# Patient Record
Sex: Male | Born: 1960 | Race: Black or African American | Hispanic: No | Marital: Married | Smoking: Never smoker
Health system: Southern US, Community
[De-identification: ages and names within clinical notes are randomized; demographics above are authoritative.]

## PROBLEM LIST (undated history)

## (undated) DIAGNOSIS — B659 Schistosomiasis, unspecified: Secondary | ICD-10-CM

## (undated) DIAGNOSIS — I10 Essential (primary) hypertension: Secondary | ICD-10-CM

## (undated) DIAGNOSIS — E669 Obesity, unspecified: Secondary | ICD-10-CM

## (undated) DIAGNOSIS — L0291 Cutaneous abscess, unspecified: Secondary | ICD-10-CM

## (undated) HISTORY — DX: Essential (primary) hypertension: I10

## (undated) HISTORY — DX: Obesity, unspecified: E66.9

## (undated) HISTORY — DX: Schistosomiasis, unspecified: B65.9

## (undated) HISTORY — PX: OTHER SURGICAL HISTORY: SHX169

## (undated) HISTORY — DX: Cutaneous abscess, unspecified: L02.91

---

## 1999-09-20 ENCOUNTER — Ambulatory Visit (HOSPITAL_COMMUNITY): Admission: RE | Admit: 1999-09-20 | Discharge: 1999-09-20 | Payer: Self-pay | Admitting: Family Medicine

## 1999-09-20 ENCOUNTER — Encounter: Payer: Self-pay | Admitting: Family Medicine

## 2000-01-13 ENCOUNTER — Encounter: Payer: Self-pay | Admitting: Family Medicine

## 2000-01-13 ENCOUNTER — Ambulatory Visit (HOSPITAL_COMMUNITY): Admission: RE | Admit: 2000-01-13 | Discharge: 2000-01-13 | Payer: Self-pay | Admitting: Family Medicine

## 2000-03-03 DIAGNOSIS — B659 Schistosomiasis, unspecified: Secondary | ICD-10-CM

## 2000-03-03 HISTORY — DX: Schistosomiasis, unspecified: B65.9

## 2001-11-01 ENCOUNTER — Emergency Department (HOSPITAL_COMMUNITY): Admission: EM | Admit: 2001-11-01 | Discharge: 2001-11-01 | Payer: Self-pay | Admitting: Emergency Medicine

## 2003-05-14 ENCOUNTER — Encounter: Admission: RE | Admit: 2003-05-14 | Discharge: 2003-05-14 | Payer: Self-pay | Admitting: Internal Medicine

## 2003-05-29 ENCOUNTER — Encounter: Admission: RE | Admit: 2003-05-29 | Discharge: 2003-05-29 | Payer: Self-pay | Admitting: Internal Medicine

## 2003-08-08 ENCOUNTER — Encounter: Admission: RE | Admit: 2003-08-08 | Discharge: 2003-08-08 | Payer: Self-pay | Admitting: Internal Medicine

## 2004-08-06 ENCOUNTER — Ambulatory Visit: Payer: Self-pay | Admitting: Internal Medicine

## 2004-08-30 ENCOUNTER — Ambulatory Visit: Payer: Self-pay | Admitting: Internal Medicine

## 2004-11-07 ENCOUNTER — Inpatient Hospital Stay (HOSPITAL_COMMUNITY): Admission: EM | Admit: 2004-11-07 | Discharge: 2004-11-08 | Payer: Self-pay | Admitting: Emergency Medicine

## 2005-04-14 ENCOUNTER — Ambulatory Visit: Payer: Self-pay | Admitting: Internal Medicine

## 2005-06-22 ENCOUNTER — Ambulatory Visit: Payer: Self-pay | Admitting: Internal Medicine

## 2005-09-17 ENCOUNTER — Emergency Department (HOSPITAL_COMMUNITY): Admission: EM | Admit: 2005-09-17 | Discharge: 2005-09-18 | Payer: Self-pay | Admitting: Emergency Medicine

## 2006-04-30 ENCOUNTER — Emergency Department (HOSPITAL_COMMUNITY): Admission: EM | Admit: 2006-04-30 | Discharge: 2006-04-30 | Payer: Self-pay | Admitting: Emergency Medicine

## 2006-07-04 ENCOUNTER — Telehealth (INDEPENDENT_AMBULATORY_CARE_PROVIDER_SITE_OTHER): Payer: Self-pay | Admitting: *Deleted

## 2006-07-10 DIAGNOSIS — I10 Essential (primary) hypertension: Secondary | ICD-10-CM

## 2006-07-10 DIAGNOSIS — E669 Obesity, unspecified: Secondary | ICD-10-CM

## 2006-07-11 ENCOUNTER — Ambulatory Visit: Payer: Self-pay | Admitting: Internal Medicine

## 2006-07-12 ENCOUNTER — Encounter (INDEPENDENT_AMBULATORY_CARE_PROVIDER_SITE_OTHER): Payer: Self-pay | Admitting: Internal Medicine

## 2006-07-12 ENCOUNTER — Ambulatory Visit: Payer: Self-pay | Admitting: Hospitalist

## 2006-07-12 LAB — CONVERTED CEMR LAB
BUN: 9 mg/dL (ref 6–23)
CO2: 24 meq/L (ref 19–32)
Calcium: 8.9 mg/dL (ref 8.4–10.5)
Chloride: 102 meq/L (ref 96–112)
Creatinine, Ser: 0.91 mg/dL (ref 0.40–1.50)
Glucose, Bld: 89 mg/dL (ref 70–99)
Potassium: 4.4 meq/L (ref 3.5–5.3)
Sodium: 139 meq/L (ref 135–145)

## 2007-11-01 ENCOUNTER — Encounter: Payer: Self-pay | Admitting: Internal Medicine

## 2007-11-01 ENCOUNTER — Ambulatory Visit: Payer: Self-pay | Admitting: Internal Medicine

## 2007-11-01 DIAGNOSIS — E785 Hyperlipidemia, unspecified: Secondary | ICD-10-CM

## 2007-11-01 LAB — CONVERTED CEMR LAB
ALT: 27 units/L (ref 0–53)
AST: 26 units/L (ref 0–37)
Albumin: 4.4 g/dL (ref 3.5–5.2)
Alkaline Phosphatase: 66 units/L (ref 39–117)
BUN: 12 mg/dL (ref 6–23)
CO2: 26 meq/L (ref 19–32)
Calcium: 9.6 mg/dL (ref 8.4–10.5)
Chloride: 103 meq/L (ref 96–112)
Cholesterol: 163 mg/dL (ref 0–200)
Creatinine, Ser: 1.05 mg/dL (ref 0.40–1.50)
Glucose, Bld: 78 mg/dL (ref 70–99)
HDL: 29 mg/dL — ABNORMAL LOW (ref >39)
LDL Cholesterol: 92 mg/dL (ref 0–99)
Potassium: 4.1 meq/L (ref 3.5–5.3)
Sodium: 140 meq/L (ref 135–145)
TSH: 0.851 microintl units/mL (ref 0.350–5.50)
Total Bilirubin: 0.4 mg/dL (ref 0.3–1.2)
Total CHOL/HDL Ratio: 5.6
Total Protein: 8.5 g/dL — ABNORMAL HIGH (ref 6.0–8.3)
Triglycerides: 211 mg/dL — ABNORMAL HIGH (ref <150)
VLDL: 42 mg/dL — ABNORMAL HIGH (ref 0–40)

## 2008-04-24 ENCOUNTER — Encounter (INDEPENDENT_AMBULATORY_CARE_PROVIDER_SITE_OTHER): Payer: Self-pay | Admitting: *Deleted

## 2008-05-01 ENCOUNTER — Ambulatory Visit: Payer: Self-pay | Admitting: Internal Medicine

## 2008-05-01 ENCOUNTER — Encounter (INDEPENDENT_AMBULATORY_CARE_PROVIDER_SITE_OTHER): Payer: Self-pay | Admitting: *Deleted

## 2008-05-01 LAB — CONVERTED CEMR LAB
BUN: 10 mg/dL (ref 6–23)
Chloride: 104 meq/L (ref 96–112)
Creatinine, Ser: 0.75 mg/dL (ref 0.40–1.50)
Glucose, Bld: 82 mg/dL (ref 70–99)

## 2008-05-28 ENCOUNTER — Encounter (INDEPENDENT_AMBULATORY_CARE_PROVIDER_SITE_OTHER): Payer: Self-pay | Admitting: Internal Medicine

## 2008-05-28 ENCOUNTER — Ambulatory Visit: Payer: Self-pay | Admitting: Internal Medicine

## 2008-05-28 LAB — CONVERTED CEMR LAB
Alkaline Phosphatase: 69 units/L (ref 39–117)
CO2: 21 meq/L (ref 19–32)
Creatinine, Ser: 0.8 mg/dL (ref 0.40–1.50)
Glucose, Bld: 89 mg/dL (ref 70–99)
Sodium: 138 meq/L (ref 135–145)
Total Bilirubin: 0.4 mg/dL (ref 0.3–1.2)
Total Protein: 8.3 g/dL (ref 6.0–8.3)

## 2008-10-22 ENCOUNTER — Encounter (INDEPENDENT_AMBULATORY_CARE_PROVIDER_SITE_OTHER): Payer: Self-pay | Admitting: *Deleted

## 2008-10-22 ENCOUNTER — Ambulatory Visit: Payer: Self-pay | Admitting: Internal Medicine

## 2008-10-22 LAB — CONVERTED CEMR LAB
Albumin: 4.1 g/dL (ref 3.5–5.2)
Alkaline Phosphatase: 68 units/L (ref 39–117)
Anti Nuclear Antibody(ANA): NEGATIVE
BUN: 12 mg/dL (ref 6–23)
CO2: 23 meq/L (ref 19–32)
Calcium: 9.1 mg/dL (ref 8.4–10.5)
Chlamydia, Swab/Urine, PCR: NEGATIVE
Chloride: 106 meq/L (ref 96–112)
GFR calc Af Amer: >60 mL/min (ref >60)
GFR calc non Af Amer: >60 mL/min (ref >60)
Glucose, Bld: 79 mg/dL (ref 70–99)
LDL Cholesterol: 111 mg/dL — ABNORMAL HIGH (ref 0–99)
Potassium: 4.6 meq/L (ref 3.5–5.3)
Sodium: 141 meq/L (ref 135–145)
Total Protein: 8 g/dL (ref 6.0–8.3)
Triglycerides: 131 mg/dL (ref <150)
Uric Acid, Serum: 9.3 mg/dL — ABNORMAL HIGH (ref 4.0–7.8)
VLDL: 26 mg/dL (ref 0–40)

## 2008-12-02 ENCOUNTER — Telehealth (INDEPENDENT_AMBULATORY_CARE_PROVIDER_SITE_OTHER): Payer: Self-pay | Admitting: *Deleted

## 2009-01-28 ENCOUNTER — Ambulatory Visit: Payer: Self-pay | Admitting: Internal Medicine

## 2009-01-28 DIAGNOSIS — M25519 Pain in unspecified shoulder: Secondary | ICD-10-CM | POA: Insufficient documentation

## 2009-01-28 LAB — CONVERTED CEMR LAB

## 2009-04-17 ENCOUNTER — Telehealth: Payer: Self-pay | Admitting: Internal Medicine

## 2009-10-15 ENCOUNTER — Emergency Department (HOSPITAL_COMMUNITY): Admission: EM | Admit: 2009-10-15 | Discharge: 2009-10-15 | Payer: Self-pay | Admitting: Emergency Medicine

## 2009-10-16 ENCOUNTER — Ambulatory Visit: Payer: Self-pay | Admitting: Internal Medicine

## 2009-10-21 ENCOUNTER — Encounter: Payer: Self-pay | Admitting: Internal Medicine

## 2009-11-16 ENCOUNTER — Ambulatory Visit: Payer: Self-pay | Admitting: Internal Medicine

## 2009-11-17 LAB — CONVERTED CEMR LAB
ALT: 20 units/L (ref 0–53)
AST: 18 units/L (ref 0–37)
BUN: 12 mg/dL (ref 6–23)
Calcium: 9.1 mg/dL (ref 8.4–10.5)
Chloride: 108 meq/L (ref 96–112)
Cholesterol: 168 mg/dL (ref 0–200)
Creatinine, Ser: 0.89 mg/dL (ref 0.40–1.50)
HDL: 32 mg/dL — ABNORMAL LOW (ref >39)
Total Bilirubin: 0.4 mg/dL (ref 0.3–1.2)
Total CHOL/HDL Ratio: 5.3
VLDL: 33 mg/dL (ref 0–40)

## 2009-11-25 ENCOUNTER — Ambulatory Visit: Payer: Self-pay | Admitting: Internal Medicine

## 2009-12-10 ENCOUNTER — Telehealth: Payer: Self-pay | Admitting: Internal Medicine

## 2010-01-18 ENCOUNTER — Telehealth: Payer: Self-pay | Admitting: Internal Medicine

## 2010-07-13 NOTE — Assessment & Plan Note (Signed)
Summary: est-1 month f/u visit/ch   Vital Signs:  Patient profile:   50 year old male Height:      73 inches (185.42 cm) Weight:      289.4 pounds (129.95 kg) BMI:     37.86 Temp:     98.9 degrees F (37.17 degrees C) oral Pulse rate:   65 / minute BP sitting:   152 / 102  (right arm) Cuff size:   large  Vitals Entered By: Theotis Barrio NT II (November 25, 2009 10:34 AM) CC: PATIENT STATES HE IS HERE FOR FOLLOW UP ON BP MED. CHANGE Is Patient Diabetic? No Pain Assessment Patient in pain? no      Nutritional Status BMI of > 30 = obese  Have you ever been in a relationship where you felt threatened, hurt or afraid?No   Does patient need assistance? Functional Status Self care Ambulation Normal Comments FOLLOW UP ON BP MEDICATION CHANE   CC:  PATIENT STATES HE IS HERE FOR FOLLOW UP ON BP MED. CHANGE.  History of Present Illness: Shawn Lawrence is a 50 yo man with PMH as outlined below.  He is here for BP f/u.  Please refer to previous office visit for details.  In summary, he developed what sounds to be a gout flare on HCTZ/lisinopril combo.  This was stopped at [facility] and continued on lisinopril.  Last visit this was increased to 40mg .  He continues taking it without problems or further gout flares.    Preventive Screening-Counseling & Management  Alcohol-Tobacco     Smoking Status: never  Caffeine-Diet-Exercise     Does Patient Exercise: no  Current Medications (verified): 1)  Lisinopril 40 Mg Tabs (Lisinopril) .... Take 1 Pill By Mouth Daily.  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 ? Gout.....Marland Kitchenno aspiration  Review of Systems      See HPI  Physical Exam  General:  alert, healthy-appearing, cooperative to examination, and overweight-appearing.   Eyes:  anicteric, wearing glasses Lungs:  normal respiratory effort and no accessory muscle use.   Neurologic:  alert & oriented X3 and gait normal.   Psych:  Oriented X3, memory intact for recent and remote, and normally interactive.     Impression & Recommendations:  Problem # 1:  HYPERTENSION (ICD-401.9) Still elevated on lisinopril 40 HCTZ not tolerated due to ? gout flare will start amlodipine 5mg  ([facility] or [facility])  His updated medication list for this problem includes:    Lisinopril 40 Mg Tabs (Lisinopril) .Marland Kitchen... Take 1 pill by mouth daily.    Amlodipine Besylate 5 Mg Tabs (Amlodipine besylate) .Marland Kitchen... Take 1 tablet by mouth once a day  BP today: 152/102 Prior BP: 145/89 (10/16/2009)  Labs Reviewed: K+: 4.6 (11/16/2009) Creat: : 0.89 (11/16/2009)   Chol: 168 (11/16/2009)   HDL: 32 (11/16/2009)   LDL: 103 (11/16/2009)   TG: 165 (11/16/2009)  Problem # 2:  DYSLIPIDEMIA (ICD-272.4) TG minimally elevated LDL at goal for pt  Labs Reviewed: SGOT: 18 (11/16/2009)   SGPT: 20 (11/16/2009)   HDL:32 (11/16/2009), 32 (10/22/2008)  LDL:103 (11/16/2009), 111 (10/22/2008)  Chol:168 (11/16/2009), 169 (  10/22/2008)  Trig:165 (11/16/2009), 131 (10/22/2008)  Problem # 3:  ? of GOUT (ICD-274.9) no further flares  Complete Medication List: 1)  Lisinopril 40 Mg Tabs (Lisinopril) .... Take 1 pill by mouth daily. 2)  Amlodipine Besylate 5 Mg Tabs (Amlodipine besylate) .... Take 1 tablet by mouth once a day  Patient Instructions: 1)  Please schedule a follow-up appointment in 3 months. 2)  Continue taking lisinopril 3)  start amlodipine, [facility].  If not can get it at  [facility] pharmacy on [address]. 4)  If you have any problems before your next visit, call clinic.  Prescriptions: AMLODIPINE BESYLATE 5 MG TABS (AMLODIPINE BESYLATE) Take 1 tablet by mouth once a day  #30 x 3   Entered and Authorized by:   Mariea Stable MD   Signed by:   Mariea Stable MD on 11/25/2009   Method used:   Print then Give to Patient   RxID:   [id number]   Prevention & Chronic Care Immunizations   Influenza vaccine: Not documented   Influenza vaccine deferral: Not available  (11/25/2009)    Tetanus booster: Not documented   Td booster deferral: Deferred  (10/16/2009)    Pneumococcal vaccine: Not documented  Other Screening   PSA: Not documented   PSA action/deferral: Discussion deferred  (01/28/2009)   Smoking status: never  (11/25/2009)  Lipids   Total Cholesterol: 168  (11/16/2009)   LDL: 103  (11/16/2009)   LDL Direct: Not documented   HDL: 32  (11/16/2009)   Triglycerides: 165  (11/16/2009)    SGOT (AST): 18  (11/16/2009)   SGPT (ALT): 20  (11/16/2009)   Alkaline phosphatase: 65  (11/16/2009)   Total bilirubin: 0.4  (11/16/2009)    Lipid flowsheet reviewed?: Yes   Progress toward LDL goal: At goal  Hypertension   Last Blood Pressure: 152 / 102  (11/25/2009)   Serum creatinine: 0.89  (11/16/2009)   Serum potassium 4.6  (11/16/2009)    Hypertension flowsheet reviewed?: Yes   Progress toward BP goal: Unchanged  Self-Management Support :    Patient will work on the following items until the next clinic visit to reach self-care goals:     Medications and monitoring: take my medicines every day, bring all of my medications to every visit  (11/25/2009)     Eating: drink diet soda or water instead of juice or soda, eat more vegetables, use fresh or frozen vegetables, eat baked foods instead of fried foods, eat fruit for snacks and desserts, limit or avoid alcohol  (11/25/2009)    Hypertension self-management support: Resources for  patients handout, Written self-care plan  (11/25/2009)   Hypertension self-care plan printed.    Lipid self-management support: Resources for patients handout, Written self-care plan  (11/25/2009)   Lipid self-care plan printed.    Self-management comments: ENCOURAGED PATIENT TO TRY WALKING AROUND THE BLOCK AT LEAST 3 DAYS A WEEK      Resource handout printed.

## 2010-07-13 NOTE — Assessment & Plan Note (Signed)
Summary: UC/FU (Shawn Lawrence) SB.   Vital Signs:  Patient profile:   50 year old male Height:      73 inches Weight:      285.9 pounds BMI:     37.86 Temp:     97.8 degrees F oral Pulse rate:   76 / minute BP sitting:   145 / 89  (right arm)  Vitals Entered By: Filomena Jungling NT II (Oct 16, 2009 11:28 AM) CC: urgent care medicine Is Patient Diabetic? No Pain Assessment Patient in pain? no      Nutritional Status BMI of > 30 = obese  Have you ever been in a relationship where you felt threatened, hurt or afraid?No   Does patient need assistance? Functional Status Self care Ambulation Normal   CC:  urgent care medicine.  History of Present Illness: Mr. Shawn Lawrence is a 24 y o man with PMH as outlined in the EMR comes today for a f/u visit.   1. HTN: See below. He is not checking BP at home.   2. Gout: He had pain on his right knee and ankle and he went to urgent care and couldn't walk. He had xray and labworks and was started on indomethacin. He also had some pain on his left ankle and had x-ray of left and right ankle that did not show any significant changes. He also had basic chemistry and was insignificant. He was instructed to stop taking hctz/lisinopril combo and was given a new prescription of lisinopril 20 mg daily. Before he was given colchicine and he states that colchicine has really not helped him in the past, and yesterday from the ED he was given a new prescription as well. He is not sure if he wants to use it or not as it did not help him in the past.   Current Medications (verified): 1)  Lisinopril 40 Mg Tabs (Lisinopril) .... Take 1 Pill By Mouth Daily. 2)  Indomethacin 50 Mg Caps (Indomethacin) .... Take 1 Pill By Mouth Three Times A Day As Needed For Pain After Meal. 3)  Omeprazole 20 Mg Cpdr (Omeprazole) .... Take 1 Pill By Mouth Before Meal, When You Start Hurting Your Stomach.  Allergies: No Known Drug Allergies  Review of Systems      See HPI  Physical  Exam  Mouth:  pharynx pink and moist.   Lungs:  normal breath sounds, no crackles, and no wheezes.   Heart:  normal rate, regular rhythm, no murmur, no gallop, and no rub.   Abdomen:  soft, non-tender, and normal bowel sounds.   Extremities:  trace left pedal edema and trace right pedal edema.   Neurologic:  alert & oriented X3.     Impression & Recommendations:  Problem # 1:  ? of GOUT (ICD-274.9) Please see HPI. Pt probably had a gouty flare and his HCTZ was d/ced and he was started on indocin, that took care of his pain with 2 doses. He states that colchicine was really not working and he feels it is OK to remove it from his med list. I informed him the potential side effects of indocin, viz. gastritis/ulcer and renal dysfunction. I also encouraged him to take omeprazole if he has to take indocin regularly and inform the clinic if he takes it more than a week so that we can get a BMET. He states that there was an attmept to aspirate his knee once in the past and it failed and so he never had synovial  fluid study done for the diagnosis of gout. I noted his uric acid was normal when checked in the ED. If he develops recurrent flares, we can start him on allopurinol.   The following medications were removed from the medication list:    Colchicine 0.6 Mg Tabs (Colchicine) .Marland Kitchen... Take 4 pills on day one, then take 2 pills every day  Problem # 2:  DYSLIPIDEMIA (ICD-272.4)  Will check followings.   Orders: T-Comprehensive Metabolic Panel [id number]) T-Lipid Profile [phone])  Labs Reviewed: SGOT: 28 (10/22/2008)   SGPT: 27 (10/22/2008)   HDL:32 (10/22/2008), 29 (11/01/2007)  LDL:111 (10/22/2008), 92 (96/29/5284)  Chol:169 (10/22/2008), 163 (11/01/2007)  Trig:131 (10/22/2008), 211 (11/01/2007)  Problem # 3:  HYPERTENSION (ICD-401.9) Pt's lisinopril 20/hctz25 combo pill was changed to lisinopril 20 from ED recently. I will change this to 40 mg daily as his BP today is 145/89, and he was  already on 2 medicine to start with. Will f/u his BMET and see him in 2 wks.    His updated medication list for this problem includes:    Lisinopril 40 Mg Tabs (Lisinopril) .Marland Kitchen... Take 1 pill by mouth daily.  Orders: T-Comprehensive Metabolic Panel ([id number])  BP today: 145/89 Prior BP: 162/111 (01/28/2009)  Labs Reviewed: K+: 4.6 (10/22/2008) Creat: : 0.92 (10/22/2008)   Chol: 169 (10/22/2008)   HDL: 32 (10/22/2008)   LDL: 111 (10/22/2008)   TG: 131 (10/22/2008)  Problem # 4:  OBESITY NOS (ICD-278.00) Discussed about the need of regular exercise and losing wt.   Complete Medication List: 1)  Lisinopril 40 Mg Tabs (Lisinopril) .... Take 1 pill by mouth daily. 2)  Indomethacin 50 Mg Caps (Indomethacin) .... Take 1 pill by mouth three times a day as needed for pain after meal. 3)  Omeprazole 20 Mg Cpdr (Omeprazole) .... Take 1 pill by mouth before meal, when you start hurting your stomach.  Patient Instructions: 1)  Please schedule a follow-up appointment in 1 month. 2)  Limit your Sodium (Salt) to less than 2 grams a day(slightly less than 1/2 a teaspoon) to prevent fluid retention, swelling, or worsening of symptoms. 3)  It is important that you exercise regularly at least 20 minutes 5 times a week. If you develop chest pain, have severe difficulty breathing, or feel very tired , stop exercising immediately and seek medical attention. 4)  You need to lose weight. Consider a lower calorie diet and regular exercise.  5)  Check your Blood Pressure regularly. If it is above: you should make an appointment. Prescriptions: OMEPRAZOLE 20 MG CPDR (OMEPRAZOLE) take 1 pill by mouth before meal, when you start hurting your stomach.  #30 x 0   Entered and Authorized by:   Jason Coop MD   Signed by:   Jason Coop MD on 10/16/2009   Method used:   Print then Give to Patient   RxID:   [id number] LISINOPRIL 40 MG TABS (LISINOPRIL) take 1 pill by mouth daily.  #30 x 0    Entered and Authorized by:   Jason Coop MD   Signed by:   Jason Coop MD on 10/16/2009   Method used:   Print then Give to Patient   RxID:   [id number]  Process Orders Check Orders Results:     [facility]: ABN not required for this insurance Tests Sent for requisitioning (Oct 18, 2009 12:25 PM):     10/16/2009: [facility] -- T-Comprehensive Metabolic Panel [[id number]] (signed)     10/16/2009: [facility] --  T-Lipid Profile [phone] (signed)    Process Orders Check Orders Results:     [facility]: ABN not required for this insurance Tests Sent for requisitioning (Oct 18, 2009 12:25 PM):     10/16/2009: [facility] -- T-Comprehensive Metabolic Panel [[id number]] (signed)     10/16/2009: [facility] -- T-Lipid Profile [phone] (signed)    Prevention & Chronic Care Immunizations   Influenza vaccine: Not documented   Influenza vaccine deferral: Deferred  (10/16/2009)    Tetanus booster: Not documented   Td booster deferral: Deferred  (10/16/2009)    Pneumococcal vaccine: Not documented  Other Screening   PSA: Not documented   PSA action/deferral: Discussion deferred  (01/28/2009)   Smoking status: never  (01/28/2009)  Lipids   Total Cholesterol: 169  (10/22/2008)   LDL: 111  (10/22/2008)   LDL Direct: Not documented   HDL: 32  (10/22/2008)   Triglycerides: 131  (10/22/2008)    SGOT (AST): 28  (10/22/2008)   SGPT (ALT): 27  (10/22/2008) CMP ordered    Alkaline phosphatase: 68  (10/22/2008)   Total bilirubin: 0.4  (10/22/2008)    Lipid flowsheet reviewed?: Yes   Progress toward LDL goal: Unchanged  Hypertension   Last Blood Pressure: 145 / 89  (10/16/2009)   Serum creatinine: 0.92  (10/22/2008)   Serum potassium 4.6  (10/22/2008) CMP ordered     Hypertension flowsheet reviewed?: Yes   Progress toward BP goal:  Unchanged  Self-Management Support :    Hypertension self-management support: Written self-care plan  (10/16/2009)   Hypertension self-care plan printed.    Lipid self-management support: Written self-care plan  (10/16/2009)   Lipid self-care plan printed.

## 2010-07-13 NOTE — Progress Notes (Signed)
Summary: refill/ hla  Phone Note Refill Request Message from:  Patient on December 10, 2009 11:12 AM  Refills Requested: Medication #1:  LISINOPRIL 40 MG TABS take 1 pill by mouth daily.   Last Refilled: 11/03/2009 last visit 6/15 and labs 6/6   Method Requested: Fax to Local Pharmacy Initial call taken by: Marin Roberts RN,  December 10, 2009 11:13 AM  Follow-up for Phone Call        Rx faxed to pharmacy Follow-up by: Mariea Stable MD,  December 10, 2009 12:23 PM    Prescriptions: LISINOPRIL 40 MG TABS (LISINOPRIL) take 1 pill by mouth daily.  #30 x 6   Entered and Authorized by:   Mariea Stable MD   Signed by:   Mariea Stable MD on 12/10/2009   Method used:   Electronically to        [facility] [phone]* (retail)       [address]       [place], [place]  [address]       Ph: [phone]       Fax: [phone]   RxID:   [id number]

## 2010-07-13 NOTE — Progress Notes (Signed)
Summary: Refill/gh  Phone Note Refill Request Message from:  Fax from Pharmacy on January 18, 2010 5:28 PM  Refills Requested: Medication #1:  LISINOPRIL 40 MG TABS take 1 pill by mouth daily.   Last Refilled: 11/03/2009  Method Requested: Electronic Initial call taken by: Angelina Ok RN,  January 18, 2010 5:28 PM  Follow-up for Phone Call        Rx faxed to pharmacy Follow-up by: Mariea Stable MD,  January 26, 2010 9:50 AM    Prescriptions: LISINOPRIL 40 MG TABS (LISINOPRIL) take 1 pill by mouth daily.  #30 x 6   Entered and Authorized by:   Mariea Stable MD   Signed by:   Mariea Stable MD on 01/26/2010   Method used:   Faxed to ...       [facility] (retail)       [address] [place], [place]  [address]       Ph: [phone]       Fax: [phone]   RxID:   [id number]

## 2010-07-13 NOTE — Letter (Signed)
Summary: PHYSICIAN DOCUMENTION SHEET  PHYSICIAN DOCUMENTION SHEET   Imported By: Margie Billet 10/21/2009 15:31:30  _____________________________________________________________________  External Attachment:    Type:   Image     Comment:   External Document

## 2010-08-30 ENCOUNTER — Ambulatory Visit (INDEPENDENT_AMBULATORY_CARE_PROVIDER_SITE_OTHER): Payer: Self-pay | Admitting: Internal Medicine

## 2010-08-30 ENCOUNTER — Encounter: Payer: Self-pay | Admitting: Internal Medicine

## 2010-08-30 DIAGNOSIS — E785 Hyperlipidemia, unspecified: Secondary | ICD-10-CM

## 2010-08-30 DIAGNOSIS — I10 Essential (primary) hypertension: Secondary | ICD-10-CM

## 2010-08-30 DIAGNOSIS — E669 Obesity, unspecified: Secondary | ICD-10-CM

## 2010-08-30 LAB — COMPREHENSIVE METABOLIC PANEL
AST: 18 U/L (ref 0–37)
Alkaline Phosphatase: 71 U/L (ref 39–117)
BUN: 14 mg/dL (ref 6–23)
Glucose, Bld: 111 mg/dL — ABNORMAL HIGH (ref 70–99)
Potassium: 4 mEq/L (ref 3.5–5.3)
Sodium: 139 mEq/L (ref 135–145)
Total Bilirubin: 0.4 mg/dL (ref 0.3–1.2)

## 2010-08-30 NOTE — Progress Notes (Signed)
  Subjective:    Patient ID: Shawn Lawrence, male    DOB: January 27, 1961, 50 y.o.   MRN: [id number]  HPI Very nice gentleman here with HTN, obesity and dyslipidemia (low HDL) here on routine follow up. No significant interval issues, feels well except for occasional back pain, paraspinal, L3-L5 region, worse at the end of the day, correlating with his weight gain.  On Lisinopril for his BP-taken off HCTZ since he was getting spells of gout   Review of Systems Sleep occasionaly marred by low back ache-no snoring or daytime somnolence. Not getting any regular exercise    Objective:   Physical Exam See vital sign flow sheet       Assessment & Plan:   Obesity HTN Dydlipidemia  Plan refer to nutrition this week Regular non-weight and weight-bearing exercise: "100 calories per mile" walking Goal-regular sustained walking 5/week  Check A1c, CMET and Lipid Panel

## 2010-08-31 LAB — LIPID PANEL
HDL: 34 mg/dL — ABNORMAL LOW (ref >39)
LDL Cholesterol: 105 mg/dL — ABNORMAL HIGH (ref 0–99)
Triglycerides: 99 mg/dL (ref <150)
VLDL: 20 mg/dL (ref 0–40)

## 2010-08-31 LAB — POCT I-STAT, CHEM 8
Calcium, Ion: 1.15 mmol/L (ref 1.12–1.32)
Creatinine, Ser: 0.7 mg/dL (ref 0.4–1.5)
Glucose, Bld: 99 mg/dL (ref 70–99)
Hemoglobin: 18.4 g/dL — ABNORMAL HIGH (ref 13.0–17.0)
Sodium: 142 mEq/L (ref 135–145)
TCO2: 30 mmol/L (ref 0–100)

## 2010-08-31 LAB — HEMOGLOBIN A1C: Hgb A1c MFr Bld: 6 % — ABNORMAL HIGH (ref <5.7)

## 2010-09-03 ENCOUNTER — Ambulatory Visit (INDEPENDENT_AMBULATORY_CARE_PROVIDER_SITE_OTHER): Payer: Self-pay | Admitting: Dietician

## 2010-09-03 DIAGNOSIS — E669 Obesity, unspecified: Secondary | ICD-10-CM

## 2010-09-03 NOTE — Patient Instructions (Signed)
Make follow up appointment for 3 weeks.  Your wife is welcome to join [place].  At that time, we will weigh you, discuss how you are doing with your   juice,    milk,   pedometer,    answer questions and alter plans if needed.   I suggest you try to aim for 2000 calories  And 55 grams of fat each day.  You can aim for 500-600 in the morning time, 500-600 in the afternoon, 500-600 calories in the evening and one snack.  It is better to eat fruit than to drink juice: I suggest the whole family drink no more than 280 ounces of juice per week- which is 8 oz per day for each of you.  It is best to buy skim or 1% milk:   adults should drink 2 8-ounce cups a day and   teenage boys should drink  2-4 cups (8ounces)  each day  Please feel free to call with questions: Lupita Leash [phone]

## 2010-09-03 NOTE — Progress Notes (Signed)
Of note: patient reported a weight gain of 54.7 # in recent years.

## 2010-09-03 NOTE — Progress Notes (Signed)
Medical Nutrition Therapy:  Appt start time: 0930 end time:  1050.  Assessment:  Primary concerns today: Weight management, Blood sugar control and Meal planning Patient and family from [place]. They have been here since 2001. They do not eat out. Usual eating pattern includes Meal 1 and 1+ snacks per day. His wife shops and cooks for the family. They have 3 teenage sons. The family consumes large amounts of juice and some soda. They eat foods that contain much oil and milk is whole. Patient feels his problem is not how much he eats but how much he drinks that has calories.  Patient reports Usual physical activity includes walking 30 minutes a day 5 days per week at most. Much of his time is spent sitting.  Diagnosis and Intervention:  Progress Towards Goal(s):  No progress as he just learned today what to work on.    Nutritional Diagnosis:  NB-1.1 Food and nutrition-related knowledge deficit NB-1.4 Self-monitoring deficit As related to not knowing how much he eats or exercises at present.  As evidenced by patient reports.. NB-2.1 Physical inactivity As related to need for activity for weight maintenance, blood sugar control.  As evidenced by patient report.  Interventions:  1- education and counseling on weight loss principles and goals- 7% for him is ~ 20#.  2- education and counseling on reading labels for portion control and calorie counting 3- education and counseling on physical activity recommendations, using a step counter to self monitor. 4- Education about his laboratory values indicative of metabolic syndrome/pre-diabetes( low HDL, high LDL, glucose and A1C)  and changes that may occur with lifestyle change. 5- Coordination of care with physician concerning goals and patient self-report of depression.     Monitoring/Evaluation:  Dietary intake, activity in 3 week(s)

## 2010-10-29 NOTE — Discharge Summary (Signed)
Shawn Lawrence, Shawn Lawrence        ACCOUNT NO.:  [id number]   MEDICAL RECORD NO.:  [id number]          PATIENT TYPE:  INP   LOCATION:  3305                         FACILITY:  [facility]   PHYSICIAN:  Kinnie Scales. Annalee Genta, M.D.DATE OF BIRTH:  March 28, 1961   DATE OF ADMISSION:  11/07/2004  DATE OF DISCHARGE:  11/08/2004                                 DISCHARGE SUMMARY   SERVICE:  ENT   DISCHARGE DIAGNOSES:  1.  Right peritonsillar abscess.  2.  Soft tissue oropharyngeal edema.  3.  Mild airway distress.   PROCEDURES:  Incision and drainage right peritonsillar abscess (Nov 07, 2004).   DISPOSITION:  The patient is discharged to home in stable condition in the  company of his family.   DISCHARGE MEDICATIONS:  1.  Augmentin 875 mg b.i.d. x10 days.  2.  Percocet 5/325 1-2 tablets q.4-6h. p.r.n. pain, dispensed 30 without      refills.  3.  Tylenol, Motrin or the above Percocet for pain.   DISCHARGE INSTRUCTIONS:  1.  Activities:  Limited.  No lifting, straining or physical exercise for      one week.  2.  Diet:  Soft and liquids.  Advanced as tolerated.  3.  Wound care:  Saline gargle as needed and Chloraseptic spray.   FOLLOWUP:  The patient will follow up in my office in 2-3 weeks for recheck  (phone [phone]) or sooner as needed.   BRIEF HISTORY:  Mr. Barbarann Ehlers is a 50 year old male with a history of  recurrent tonsillitis.  He reports history of a peritonsillar abscess  approximately one year ago requiring surgical incision and drainage.  He has  a two day history of progressive sore throat, poor oral intake and severe  right sided otalgia.  Presented to the [facility] Emergency  Department in mild airway distress with significant oropharyngeal swelling.  CT scan was obtained secondary to significant trismus.  The patient was  found to have an approximately 2 cm peritonsillar abscess on the right hand  side with cervical lymphadenopathy.  Given the patient's history  examination  and findings, I recommended that we undertake incision and drainage of  peritonsillar abscess and admission to the hospital for overnight  observation.   HOSPITAL COURSE:  The patient was admitted to the ENT service under Dr.  Thurmon Fair care.  In the [facility] Emergency Department, the local  anesthetic incision and drainage of right peritonsillar abscess was  performed approximately 15 cc of purulent material was aspirated from the  right peritonsillar space.  The patient had significant improvement in  discomfort with the above procedure.   The patient had significant uvular and oropharyngeal edema from his acute  infection, and we opted to admit him to the hospital for observational  management and possible airway management.   The patient was transferred from the [facility] Emergency Department to  stepdown unit 3300 for continuous pulse oxymetry and cardiac monitoring.  On  the first postoperative morning, the patient was afebrile.  There was a  significant improvement in discomfort.  He was tolerating a liquid oral diet  and oral secretions without difficulty.  There was no stridor or airway  distress.  The patient's vital signs, including room air and pulse oximetry  were stable throughout the night.   The patient was discharged home in stable condition in the company of his  family with the above discharge instructions and medications.  The patient  will contact my office for follow up as an outpatient in 2-3 weeks or sooner  if warranted by worsening symptoms.  The patient and his family are  comfortable with the discharge planning as outlined above and will follow up  as above.      DLS/MEDQ  D:  16/03/9603  T:  11/08/2004  Job:  [id number]

## 2010-11-03 ENCOUNTER — Ambulatory Visit: Payer: Self-pay | Admitting: Internal Medicine

## 2010-11-11 ENCOUNTER — Encounter: Payer: Self-pay | Admitting: Internal Medicine

## 2010-11-11 ENCOUNTER — Ambulatory Visit (INDEPENDENT_AMBULATORY_CARE_PROVIDER_SITE_OTHER): Payer: Self-pay | Admitting: Internal Medicine

## 2010-11-11 DIAGNOSIS — I10 Essential (primary) hypertension: Secondary | ICD-10-CM

## 2010-11-11 DIAGNOSIS — Z23 Encounter for immunization: Secondary | ICD-10-CM

## 2010-11-11 DIAGNOSIS — M109 Gout, unspecified: Secondary | ICD-10-CM | POA: Insufficient documentation

## 2010-11-11 DIAGNOSIS — Z1211 Encounter for screening for malignant neoplasm of colon: Secondary | ICD-10-CM | POA: Insufficient documentation

## 2010-11-11 DIAGNOSIS — E669 Obesity, unspecified: Secondary | ICD-10-CM

## 2010-11-11 DIAGNOSIS — Z0000 Encounter for general adult medical examination without abnormal findings: Secondary | ICD-10-CM

## 2010-11-11 MED ORDER — AMLODIPINE BESYLATE 5 MG PO TABS: 5.0000 mg | ORAL_TABLET | Freq: Every day | ORAL | Status: DC

## 2010-11-11 MED ORDER — LISINOPRIL 40 MG PO TABS: 40.0000 mg | ORAL_TABLET | Freq: Every day | ORAL | Status: DC

## 2010-11-11 NOTE — Assessment & Plan Note (Signed)
Per the patient he has trouble with gout when he eats too much meat. He also had trouble when taking hydrochlorothiazide and therefore this was discontinued. If he in fact does have gouty attacks that are prompted by food, with his next lab work we may consider measuring a uric acid level. As his uric acid level is likely to rise as he gets older he may benefit from suppression if it is in fact high.

## 2010-11-11 NOTE — Assessment & Plan Note (Addendum)
Compliant with lisinopril - stated he was not on amlodipine.  Still high today.  Will start amlodipine at 5mg  and refill lisinopril.  We will see him back in 4 weeks for blood pressure check.

## 2010-11-11 NOTE — Assessment & Plan Note (Signed)
Patient has done a good job of losing 10 pounds sinceiser last visit. I counseled him on safe effective weight loss. His goal is to lose more than 2 pounds per week.

## 2010-11-11 NOTE — Patient Instructions (Signed)
Please take your blood pressure pills as instructed.  I would like to see you in follow-up in 4-6 weeks.   Your new blood pressure medicine is called amlodipine and you should take 1 pill daily. You will receive stool hemoccult cards to screen for colon cancer through the [facility] study.  Please follow their directions. Continue the great work on your weight.  Congratulations on losing 10 pounds.

## 2010-11-11 NOTE — Assessment & Plan Note (Addendum)
Pt has never had colonoscopy and is now 55 - he is eligible for an interest in participating in the [facility] study. Therefore we will let him continue with this. Also does not think he has ever had a tetanus shot so we will do this today as well.

## 2010-11-11 NOTE — Progress Notes (Signed)
  Subjective:    Patient ID: Shawn Lawrence, male    DOB: 1961-02-27, 50 y.o.   MRN: [id number]  HPI Comments: Patient is a very pleasant man who presents for followup on his blood pressure. It remains elevated. He supports compliance with his lisinopril. There is amlodipine on his medication list he states he is never taken this. He denies any chest pain or shortness of breath. Says he has a mild occasional headaches. Otherwise no complaints.  He has also been working on his weight. He states he stopped drinking sugary drinks and has lost 10 pounds.  At 47 years Lawrence he has never had a colonoscopy. He denies any black stools or tarry stools. He states he wishes to proceed with the [facility] study.  He also states he thinks he is never to take the shot.     Review of Systems  Constitutional: Negative for fever, chills and diaphoresis.  HENT: Negative for congestion, sore throat and trouble swallowing.   Eyes: Negative for visual disturbance.  Respiratory: Negative for cough, chest tightness and shortness of breath.   Cardiovascular: Negative for chest pain, palpitations and leg swelling.  Gastrointestinal: Negative for nausea, vomiting, abdominal pain, diarrhea, constipation and blood in stool.  Genitourinary: Negative for dysuria, urgency, frequency and difficulty urinating.  Musculoskeletal: Negative for myalgias and arthralgias.  Skin: Negative for rash.  Neurological: Negative for dizziness, weakness and numbness.  Hematological: Negative for adenopathy.  Psychiatric/Behavioral: Negative for behavioral problems, confusion and dysphoric mood.  All other systems reviewed and are negative.       Objective:   Physical Exam  Constitutional: He is oriented to person, place, and time. He appears well-developed and well-nourished. No distress.  HENT:  Head: Normocephalic and atraumatic.  Mouth/Throat: No oropharyngeal exudate.  Eyes: Conjunctivae and EOM are normal. Pupils are equal,  round, and reactive to light.  Neck: Normal range of motion. Neck supple.  Cardiovascular: Normal rate, regular rhythm and intact distal pulses.   No murmur heard. Pulmonary/Chest: Effort normal and breath sounds normal. He has no wheezes.  Abdominal: Soft. Bowel sounds are normal. He exhibits no distension and no mass. There is no tenderness. There is no rebound and no guarding.  Musculoskeletal: Normal range of motion. He exhibits no edema and no tenderness.  Neurological: He is alert and oriented to person, place, and time. No cranial nerve deficit. Coordination normal.  Skin: Skin is warm and dry. No rash noted.  Psychiatric: He has a normal mood and affect. His behavior is normal. Judgment and thought content normal.          Assessment & Plan:

## 2010-12-09 ENCOUNTER — Ambulatory Visit (INDEPENDENT_AMBULATORY_CARE_PROVIDER_SITE_OTHER): Payer: Self-pay | Admitting: Internal Medicine

## 2010-12-09 ENCOUNTER — Encounter: Payer: Self-pay | Admitting: Internal Medicine

## 2010-12-09 VITALS — BP 153/100 | HR 78 | Temp 97.4°F | Ht 73.0 in | Wt 281.3 lb

## 2010-12-09 DIAGNOSIS — I10 Essential (primary) hypertension: Secondary | ICD-10-CM

## 2010-12-09 DIAGNOSIS — M109 Gout, unspecified: Secondary | ICD-10-CM

## 2010-12-09 MED ORDER — AMLODIPINE BESYLATE 5 MG PO TABS: 10.0000 mg | ORAL_TABLET | Freq: Every day | ORAL | Status: DC

## 2010-12-09 NOTE — Patient Instructions (Addendum)
Schedule a followup appointment in one to 2 months for blood pressure check, or earlier if needed. Start taking 2 of your amlodipine tablets a day. A few purchase a home blood pressure cuff , keep a record of your blood pressure measurements at home and bring this to your next office visit.

## 2010-12-09 NOTE — Progress Notes (Signed)
  Subjective:    Patient ID: Shawn Lawrence, male    DOB: 06/22/1960, 50 y.o.   MRN: [id number]  HPI Please see the A&P for the status of the pt's chronic medical problems.    Review of Systems  Constitutional: Negative for fever, activity change, appetite change and fatigue.  HENT: Negative for hearing loss, neck pain, sinus pressure and tinnitus.   Eyes: Negative for visual disturbance.  Respiratory: Negative for cough and shortness of breath.   Cardiovascular: Negative for chest pain, palpitations and leg swelling.  Gastrointestinal: Negative for nausea, vomiting, abdominal pain and diarrhea.  Skin: Negative for rash.  Neurological: Negative for syncope, weakness and numbness.       Objective:   Physical Exam VItal signs reviewed and stable.  Blood pressure slightly elevated above goal GEN: No apparent distress.  Alert and oriented x 3.  Pleasant, conversant, and cooperative to exam. HEENT: head is autraumatic and normocephalic.  Neck is supple without palpable masses or lymphadenopathy. Vision intact.  EOMI.   Sclerae anicteric.  Conjunctivae without pallor or injection. Mucous membranes are moist.  RESP:  Lungs are clear to ascultation bilaterally with good air movement.  No wheezes, ronchi, or rubs. CARDIOVASCULAR: regular rate, normal rhythm.  Clear S1, S2, no murmurs, gallops, or rubs. EXT: warm and dry.  Peripheral pulses equal, intact, and +2 globally.  No clubbing or cyanosis.  No edema in bilateral lower extremities. SKIN: warm and dry with normal turgor.  No rashes or abnormal lesions observed.         Assessment & Plan:

## 2010-12-09 NOTE — Assessment & Plan Note (Addendum)
He has tolerated the addition of amlodipine 5 mg well. Denies any adverse effects.  Blood pressure remains elevated above goal today.  For improved control, will increase his amlodipine to 10 mg daily have him return in one to 2 months for blood pressure check.    He is interested in purchasing a home blood pressure cuff. Encouraged him to do this and to keep a record of his blood pressure measurements and to bring this with him to his next office visit.

## 2010-12-09 NOTE — Assessment & Plan Note (Signed)
This appears stable. Patient reports some pain in his right great toe approximately 4 days prior to his appointment. He states that this is resolved on its own and he has not needed to use any colchicine.  There are no findings on exam to suggest acute gout flare.  I asked him to return to the clinic if he develops a recurrence of symptoms.

## 2011-03-04 ENCOUNTER — Other Ambulatory Visit: Payer: Self-pay | Admitting: *Deleted

## 2011-03-04 NOTE — Telephone Encounter (Signed)
Pt said that his Amlodipine 5 has been increased to 10 mg.  Pt would like to get a prescription for the 10 mg and would like to get 100 for $10.00.

## 2011-07-06 ENCOUNTER — Emergency Department (HOSPITAL_COMMUNITY)
Admission: EM | Admit: 2011-07-06 | Discharge: 2011-07-06 | Disposition: A | Discharge status: Home / Self Care | Payer: Self-pay | Attending: Emergency Medicine | Admitting: Emergency Medicine

## 2011-07-06 ENCOUNTER — Other Ambulatory Visit: Payer: Self-pay

## 2011-07-06 ENCOUNTER — Encounter (HOSPITAL_COMMUNITY): Payer: Self-pay | Admitting: Emergency Medicine

## 2011-07-06 DIAGNOSIS — Z79899 Other long term (current) drug therapy: Secondary | ICD-10-CM | POA: Insufficient documentation

## 2011-07-06 DIAGNOSIS — I10 Essential (primary) hypertension: Secondary | ICD-10-CM

## 2011-07-06 DIAGNOSIS — R55 Syncope and collapse: Secondary | ICD-10-CM | POA: Insufficient documentation

## 2011-07-06 DIAGNOSIS — R42 Dizziness and giddiness: Secondary | ICD-10-CM

## 2011-07-06 DIAGNOSIS — Z862 Personal history of diseases of the blood and blood-forming organs and certain disorders involving the immune mechanism: Secondary | ICD-10-CM | POA: Insufficient documentation

## 2011-07-06 DIAGNOSIS — Z8639 Personal history of other endocrine, nutritional and metabolic disease: Secondary | ICD-10-CM | POA: Insufficient documentation

## 2011-07-06 LAB — DIFFERENTIAL
Basophils Absolute: 0.1 10*3/uL (ref 0.0–0.1)
Basophils Relative: 1 % (ref 0–1)
Eosinophils Absolute: 0.4 10*3/uL (ref 0.0–0.7)
Eosinophils Relative: 4 % (ref 0–5)
Monocytes Absolute: 0.7 10*3/uL (ref 0.1–1.0)

## 2011-07-06 LAB — CBC
MCH: 28 pg (ref 26.0–34.0)
MCHC: 33.3 g/dL (ref 30.0–36.0)
MCV: 84.3 fL (ref 78.0–100.0)
Platelets: 212 10*3/uL (ref 150–400)
RDW: 13.5 % (ref 11.5–15.5)
WBC: 9.8 10*3/uL (ref 4.0–10.5)

## 2011-07-06 LAB — BASIC METABOLIC PANEL
Calcium: 8.8 mg/dL (ref 8.4–10.5)
Creatinine, Ser: 0.79 mg/dL (ref 0.50–1.35)
GFR calc non Af Amer: >90 mL/min (ref >90)
Sodium: 139 mEq/L (ref 135–145)

## 2011-07-06 LAB — URINE MICROSCOPIC-ADD ON

## 2011-07-06 LAB — URINALYSIS, ROUTINE W REFLEX MICROSCOPIC
Ketones, ur: NEGATIVE mg/dL
Leukocytes, UA: NEGATIVE
Protein, ur: 30 mg/dL — AB
Urobilinogen, UA: 0.2 mg/dL (ref 0.0–1.0)

## 2011-07-06 LAB — TROPONIN I: Troponin I: <0.3 ng/mL (ref <0.30)

## 2011-07-06 MED ORDER — MECLIZINE HCL 25 MG PO TABS: 25.0000 mg | ORAL_TABLET | Freq: Three times a day (TID) | ORAL | Status: AC | PRN

## 2011-07-06 MED ORDER — MECLIZINE HCL 25 MG PO TABS
25.0000 mg | ORAL_TABLET | Freq: Once | ORAL | Status: AC
  Administered 2011-07-06: 25 mg via ORAL
  Filled 2011-07-06: qty 1

## 2011-07-06 NOTE — ED Notes (Signed)
Pt ambulatory to restroom with steady gait, pt reports dizziness "a little bit, when I first got up". Will continue to monitor pt.

## 2011-07-06 NOTE — ED Notes (Signed)
PT. REPORTS FELT DIZZY / DROWSY  AND HAD A BRIEF SYNCOPAL EPISODE WHILE AT BATHROOM THIS EVENING , DENIES INJURY , ALERT AND ORIENTED , AMBULATORY.

## 2011-07-06 NOTE — ED Notes (Signed)
CBG CHECKED BY EMT R [id number]

## 2011-07-06 NOTE — ED Notes (Signed)
EKG DONE BY EMT R Albertha Beattie-GIVEN TO DR Preston Fleeting, NO OLD

## 2011-07-06 NOTE — ED Notes (Signed)
Pt reports having hypertension and being non compliant with medications. Pt states "no, i do not take it every day".

## 2011-07-06 NOTE — ED Provider Notes (Signed)
History     CSN: [id number]  Arrival date & time 07/06/11  1191   First MD Initiated Contact with Patient 07/06/11 [phone]      Chief Complaint  Patient presents with  . Near Syncope    (Consider location/radiation/quality/duration/timing/severity/associated sxs/prior treatment) The history is provided by the patient.   51 year old male had 3 episodes of dizziness at home. It started at 1:30 AM when he woke up and felt dizzy. He also had a slower he walked to the bathroom and got dizzy while in the bathroom and he had a third episode while he was in bed. Each episode lasted only about 20-30 seconds. He describes a near syncopal feeling. He denies vertigo denies chest pain, nausea, vomiting, diaphoresis. He has not had similar symptoms before. Symptoms are moderate.  Past Medical History  Diagnosis Date  . Hypertension   . Obesity   . Abscess 10/2004 s/p I and D    peritonsilar  . 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 Gout     no aspiration    History reviewed. No pertinent past surgical history.  Family History  Problem Relation Age of Onset  . Diabetes Mother     History  Substance Use Topics  . Smoking status: Never Smoker   . Smokeless tobacco: Not on file  . Alcohol Use: No      Review of Systems  All other systems reviewed and are negative.    Allergies  Hctz  Home Medications   Current Outpatient Rx  Name Route Sig Dispense Refill  . AMLODIPINE BESYLATE 5 MG PO TABS Oral Take 2 tablets (10 mg total) by mouth daily. 30 tablet 11  . LISINOPRIL 40 MG PO TABS Oral Take 1 tablet (40 mg total) by mouth daily. 30 tablet 11    BP 181/102  Pulse 61  Temp(Src) 98.1 F (36.7 C) (Oral)  Resp 20  SpO2 99%  Physical Exam  Nursing note and vitals reviewed.  51 year old male who is resting comfortably and in no acute distress. Vital signs are significant for severe hypertension with blood pressure 205/130. Oxygen saturation is 97% which is normal. Head is  normocephalic atraumatic. PERRLA, EOMI. Fundi show no hemorrhage or exudate or. Neck is supple without adenopathy or bruit. Lungs are clear without rales, wheezes, rhonchi. Back is nontender. Heart has regular rate and rhythm without murmur. There is no chest wall tenderness. Abdomen is soft, flat, nontender without masses or hepatosplenomegaly. Extremities have no cyanosis or edema, full range of motion present. Skin is warm and moist without rash. Neurologic: Mental status is normal, cranial nerves are intact, there no focal motor or sensory deficits.  ED Course  Procedures (including critical care time) Results for orders placed during the hospital encounter of 07/06/11  GLUCOSE, CAPILLARY      Component Value Range   Glucose-Capillary 98  70 - 99 (mg/dL)  CBC      Component Value Range   WBC 9.8  4.0 - 10.5 (K/uL)   RBC 5.60  4.22 - 5.81 (MIL/uL)   Hemoglobin 15.7  13.0 - 17.0 (g/dL)   HCT 95.6  21.3 - 08.6 (%)   MCV 84.3  78.0 - 100.0 (fL)   MCH 28.0  26.0 - 34.0 (pg)   MCHC 33.3  30.0 - 36.0 (g/dL)   RDW 57.8  46.9 - 62.9 (%)   Platelets 212  150 - 400 (K/uL)  DIFFERENTIAL      Component Value Range  Neutrophils Relative 46  43 - 77 (%)   Neutro Abs 4.5  1.7 - 7.7 (K/uL)   Lymphocytes Relative 42  12 - 46 (%)   Lymphs Abs 4.1 (*) 0.7 - 4.0 (K/uL)   Monocytes Relative 7  3 - 12 (%)   Monocytes Absolute 0.7  0.1 - 1.0 (K/uL)   Eosinophils Relative 4  0 - 5 (%)   Eosinophils Absolute 0.4  0.0 - 0.7 (K/uL)   Basophils Relative 1  0 - 1 (%)   Basophils Absolute 0.1  0.0 - 0.1 (K/uL)  BASIC METABOLIC PANEL      Component Value Range   Sodium 139  135 - 145 (mEq/L)   Potassium 3.8  3.5 - 5.1 (mEq/L)   Chloride 104  96 - 112 (mEq/L)   CO2 26  19 - 32 (mEq/L)   Glucose, Bld 107 (*) 70 - 99 (mg/dL)   BUN 11  6 - 23 (mg/dL)   Creatinine, Ser 4.09  0.50 - 1.35 (mg/dL)   Calcium 8.8  8.4 - 81.1 (mg/dL)   GFR calc non Af Amer >90  >90 (mL/min)   GFR calc Af Amer >90  >90 (mL/min)    URINALYSIS, ROUTINE W REFLEX MICROSCOPIC      Component Value Range   Color, Urine YELLOW  YELLOW    APPearance CLEAR  CLEAR    Specific Gravity, Urine 1.013  1.005 - 1.030    pH 6.0  5.0 - 8.0    Glucose, UA NEGATIVE  NEGATIVE (mg/dL)   Hgb urine dipstick MODERATE (*) NEGATIVE    Bilirubin Urine NEGATIVE  NEGATIVE    Ketones, ur NEGATIVE  NEGATIVE (mg/dL)   Protein, ur 30 (*) NEGATIVE (mg/dL)   Urobilinogen, UA 0.2  0.0 - 1.0 (mg/dL)   Nitrite NEGATIVE  NEGATIVE    Leukocytes, UA NEGATIVE  NEGATIVE   TROPONIN I      Component Value Range   Troponin I <0.30  <0.30 (ng/mL)  URINE MICROSCOPIC-ADD ON      Component Value Range   Squamous Epithelial / LPF FEW (*) RARE    WBC, UA 0-2  <3 (WBC/hpf)   RBC / HPF 0-2  <3 (RBC/hpf)   Bacteria, UA RARE  RARE    No results found.    Date: 07/06/2011  Rate: 61  Rhythm: normal sinus rhythm  QRS Axis: normal  Intervals: PR prolonged  ST/T Wave abnormalities: normal  Conduction Disutrbances:nonspecific intraventricular conduction delay  Narrative Interpretation: First degree AV block with nonspecific intraventricular conduction delay.  Old EKG Reviewed: none available  Workup is essentially negative. He states that he actually has continued to have several more episodes of dizziness while in the emergency department. He is empirically given a dose of meclizine with considerable improvement. He'll be discharged with a prescription for meclizine.  1. Vertigo   2. Hypertension       MDM  Dizziness - initially it is unclear whether this is orthostatic or vertiginous. Workup is ordered.        Dione Booze, MD 07/06/11 [phone]

## 2011-07-26 ENCOUNTER — Encounter: Payer: Self-pay | Admitting: Internal Medicine

## 2011-07-26 ENCOUNTER — Ambulatory Visit (INDEPENDENT_AMBULATORY_CARE_PROVIDER_SITE_OTHER): Payer: Self-pay | Admitting: Internal Medicine

## 2011-07-26 DIAGNOSIS — E785 Hyperlipidemia, unspecified: Secondary | ICD-10-CM

## 2011-07-26 DIAGNOSIS — R0609 Other forms of dyspnea: Secondary | ICD-10-CM

## 2011-07-26 DIAGNOSIS — I10 Essential (primary) hypertension: Secondary | ICD-10-CM

## 2011-07-26 DIAGNOSIS — E669 Obesity, unspecified: Secondary | ICD-10-CM

## 2011-07-26 DIAGNOSIS — Z0000 Encounter for general adult medical examination without abnormal findings: Secondary | ICD-10-CM

## 2011-07-26 DIAGNOSIS — R319 Hematuria, unspecified: Secondary | ICD-10-CM | POA: Insufficient documentation

## 2011-07-26 DIAGNOSIS — R42 Dizziness and giddiness: Secondary | ICD-10-CM

## 2011-07-26 DIAGNOSIS — R0600 Dyspnea, unspecified: Secondary | ICD-10-CM

## 2011-07-26 LAB — LIPID PANEL
Cholesterol: 175 mg/dL (ref 0–200)
HDL: 32 mg/dL — ABNORMAL LOW (ref >39)
LDL Cholesterol: 121 mg/dL — ABNORMAL HIGH (ref 0–99)
Triglycerides: 109 mg/dL (ref <150)

## 2011-07-26 MED ORDER — LISINOPRIL 40 MG PO TABS: 40.0000 mg | ORAL_TABLET | Freq: Every day | ORAL | Status: DC

## 2011-07-26 MED ORDER — AMLODIPINE BESYLATE 10 MG PO TABS: 10.0000 mg | ORAL_TABLET | Freq: Every day | ORAL | Status: DC

## 2011-07-26 NOTE — Assessment & Plan Note (Signed)
Does not have a history of HF or sleep apnea in the past -Will send for 2D echocardiogram to eval structural heart disease -Send for sleep study for eval for obstructive sleep apnea -Weight loss encourage -

## 2011-07-26 NOTE — Assessment & Plan Note (Addendum)
Lipid panel 1 year ago was good with LDL of 105. -Will repeat lipid panel today -Encourage Diet and exercise

## 2011-07-26 NOTE — Assessment & Plan Note (Signed)
Will need colonoscopy.

## 2011-07-26 NOTE — Progress Notes (Signed)
HPI: Mr. Shawn Lawrence is a 51 yo M with PMH of HTN, gout, obesity presents today for follow up.  He was seen in the ED on 1/23 for lightheadedness and HTN.  He was sent home with Norvasc 10mg , Lisinopril 40mg  and Meclizine.  He was not compliant with his BP meds in the past but states that he has been compliant since ED visit.  However, he did not take the Meclizine because his lightheadedness has resolved 2 days after ED visit.  He reports sleeping on 3 pillows at night and cannot lay flat, PND and orthopnea.  His wife told him that he snores at night and stop breathing for several seconds during sleep.  He feels very tired during the day because he is not well-rested at night.  Also c/o back pain that feels tight with certain position, this has been a chronic problem for him.  He states that he does not want another pill for his BP today and wants a prescription for 10mg  of Norvasc instead of the 2 of 5mg  of Norvasc.Also in the ED, he was found to have moderate Hb on UA, he does have a history of hematuria and had negative renal US in the past. He reports family history of kidney stones but not personal history.   He has not been evaluated by urology before.    ROS: as per HPI  PE: General: alert, well-developed, and cooperative to examination.  Neck: supple, full ROM, no thyromegaly, no JVD, and no carotid bruits. He does have acanthosis nigrican around his neck.  Lungs: normal respiratory effort, no accessory muscle use, normal breath sounds, no crackles, and no wheezes. Heart: slightly distant heart sound, normal rate, regular rhythm, no murmur, no gallop, and no rub.  Abdomen:obese, soft, non-tender, normal bowel sounds, no distention, no guarding, no rebound tenderness Msk: no joint swelling, no joint warmth, and no redness over joints.  Pulses: 2+ DP/PT pulses bilaterally Extremities: No cyanosis, clubbing, ++trace pitting edema on LE bilaterally Neurologic: nonfocal Skin: turgor normal and no  rashes.

## 2011-07-26 NOTE — Patient Instructions (Signed)
Will schedule for an ultrasound of your heart- Echocardiogram Will schedule for sleep study Continue taking Norvasc 10mg  one tablet daily Continue taking Linsinopril 40mg  one tablet daily Will check your blood work today and we will call you with any abnormal lab results Will schedule for colonoscopy since you are above 51 years old to screen for colon cancer Follow up with Dr. Anselm Jungling in 1-2 months

## 2011-07-26 NOTE — Assessment & Plan Note (Addendum)
Unclear if vertigo or orthostatic hypotension.  His history is not very accurate because he stated that certain position made him feel lightheaded especially standing up from sitting .  He states that his lightheadedness has resolved after he came to the ED.  I did not hear any carotid bruit on exam.  No heart murmurs appreciated -Will check orthostatic vitals today -May need cardiac monitor if persists in the future  Orthostatics: Lying 141/98        P 78 Sitting 142/97       P 93 Standing 142/96   P 91

## 2011-07-26 NOTE — Assessment & Plan Note (Addendum)
Kidney stones vs.Blood clots in bladder vs. Bladder cancer if he is a smoker.  On physical exam he does have acanthosis nigrican which is concerning for DM vs malignancy. Unlikely DM since his glu was 107, will recheck his BMP today to make sure that his glu is not >126.  He does have a history of hematuria in the past with negative renal U/S.  He reports family hx of kidney stones.  Urinalysis on 1/23 showed moderate Hb but microscopy shows RBC 0-3.   -Will recheck UA today -May need urine cytology and refer to urology

## 2011-07-26 NOTE — Assessment & Plan Note (Addendum)
Not well-controlled with BP still 148/96 but much improved because in the ED, his systolic BP was in 180's. But patient does not want to start another medication today. Will continue Norvasc 10 mg and Lisinopril 40mg  Will consider adding diuretics if still not controlled next office visit

## 2011-07-27 LAB — URINALYSIS, ROUTINE W REFLEX MICROSCOPIC
Bilirubin Urine: NEGATIVE
Glucose, UA: NEGATIVE mg/dL
Ketones, ur: NEGATIVE mg/dL
Nitrite: NEGATIVE
Specific Gravity, Urine: 1.02 (ref 1.005–1.030)
Urobilinogen, UA: 0.2 mg/dL (ref 0.0–1.0)
pH: 5.5 (ref 5.0–8.0)

## 2011-07-27 LAB — BASIC METABOLIC PANEL WITH GFR
BUN: 12 mg/dL (ref 6–23)
CO2: 25 mEq/L (ref 19–32)
Calcium: 9.8 mg/dL (ref 8.4–10.5)
Chloride: 104 mEq/L (ref 96–112)
Creat: 0.85 mg/dL (ref 0.50–1.35)
GFR, Est African American: >89 mL/min
Glucose, Bld: 87 mg/dL (ref 70–99)

## 2011-07-27 LAB — URINALYSIS, MICROSCOPIC ONLY
Bacteria, UA: NONE SEEN
Crystals: NONE SEEN

## 2011-07-27 NOTE — Progress Notes (Signed)
Addended by: Maura Crandall on: 07/27/2011 04:45 PM   Modules accepted: Orders

## 2011-08-15 ENCOUNTER — Ambulatory Visit (HOSPITAL_BASED_OUTPATIENT_CLINIC_OR_DEPARTMENT_OTHER): Payer: Self-pay | Attending: Internal Medicine | Admitting: Radiology

## 2011-08-15 VITALS — Ht 72.0 in | Wt 273.0 lb

## 2011-08-15 DIAGNOSIS — R0600 Dyspnea, unspecified: Secondary | ICD-10-CM

## 2011-08-15 DIAGNOSIS — G4733 Obstructive sleep apnea (adult) (pediatric): Secondary | ICD-10-CM | POA: Insufficient documentation

## 2011-08-20 DIAGNOSIS — G4733 Obstructive sleep apnea (adult) (pediatric): Secondary | ICD-10-CM

## 2011-08-20 NOTE — Procedures (Signed)
Shawn Lawrence, Shawn Lawrence        ACCOUNT NO.:  [id number]  MEDICAL RECORD NO.:  [id number]          PATIENT TYPE:  OUT  LOCATION:  SLEEP CENTER                 FACILITY:  [facility]  PHYSICIAN:  Saraiya Kozma D. Maple Hudson, MD, FCCP, FACPDATE OF BIRTH:  [birth year]/1960  DATE OF STUDY:  08/15/2011                           NOCTURNAL POLYSOMNOGRAM  REFERRING PHYSICIAN:  Blanch Media, M.D.  REFERRING PHYSICIAN:  Blanch Media, M.D.  INDICATION FOR STUDY:  Hypersomnia with sleep apnea.  EPWORTH SLEEPINESS SCORE:  10/24.  BMI 37, weight 273 pounds, height 72 inches, neck 16.5 inches.  HOME MEDICATIONS:  Charted as "none taken."  SLEEP ARCHITECTURE:  Split study protocol.  During the diagnostic phase, total sleep time 169.5 minutes with sleep efficiency 78.1%.  Stage I was 19.5%.  Stage II 80.5%, stages III and REM were absent.  Sleep latency 7 minutes.  Awake after sleep onset 39 minutes.  Arousal index 77.5. Bedtime medication:  None.  RESPIRATORY DATA:  Split study protocol.  Apnea/hypopnea index (AHI) 87.8 per hour.  A total of 248 events was scored including 117 obstructive apneas, 1 central apnea, 4 mixed apneas, 126 hypopneas. Events were not positional.  CPAP was titrated to 13 CWP, AHI 1.7 per hour.  He wore a medium Respironics EasyLife nasal mask with heated humidifier and a C-Flex setting of 3.  OXYGEN DATA:  Moderately loud snoring with oxygen desaturation to a nadir of 80% on room air.  With CPAP titration, snoring was prevented and mean oxygen saturation held 96.4% on room air.  CARDIAC DATA:  Sinus rhythm with occasional PVC.  MOVEMENT/PARASOMNIA:  A few incidental limb jerks were noted with no effect on sleep.  No bathroom trips.  IMPRESSION/RECOMMENDATION: 1. Severe obstructive sleep apnea/hypopnea syndrome, AHI 87.8 per hour     with nonpositional events, moderately loud snoring and oxygen     desaturation to a nadir of 80% on room air. 2. Successful CPAP titration to  13 CWP, AHI 1.7 per hour.  He wore a     medium Respironics EasyLife nasal     mask with heated humidifier and a C-Flex setting of 3.  Snoring was     prevented and mean oxygen saturation held at 96.4% on room air.     Shawn Lawrence D. Maple Hudson, MD, [facility], FACP Diplomate, American Board of Sleep Medicine    CDY/MEDQ  D:  08/20/2011 10:11:33  T:  08/20/2011 23:38:42  Job:  [id number]

## 2011-08-23 ENCOUNTER — Ambulatory Visit (HOSPITAL_COMMUNITY): Payer: Self-pay | Attending: Internal Medicine

## 2011-08-23 ENCOUNTER — Ambulatory Visit (HOSPITAL_COMMUNITY): Payer: Self-pay

## 2011-11-01 ENCOUNTER — Encounter: Payer: Self-pay | Admitting: Internal Medicine

## 2011-11-22 ENCOUNTER — Encounter: Payer: Self-pay | Admitting: Internal Medicine

## 2011-11-22 ENCOUNTER — Ambulatory Visit: Payer: Self-pay | Admitting: Internal Medicine

## 2011-12-27 ENCOUNTER — Encounter: Payer: Self-pay | Admitting: Internal Medicine

## 2012-01-10 IMAGING — CR DG ANKLE COMPLETE 3+V*L*
3 series · 3 of 3 positions shown · non-contrast
Comparison: None.

CLINICAL DATA: Left ankle pain for 3 days.

LEFT ANKLE COMPLETE - 3+ VIEW

[view not recorded (1 of 3)]
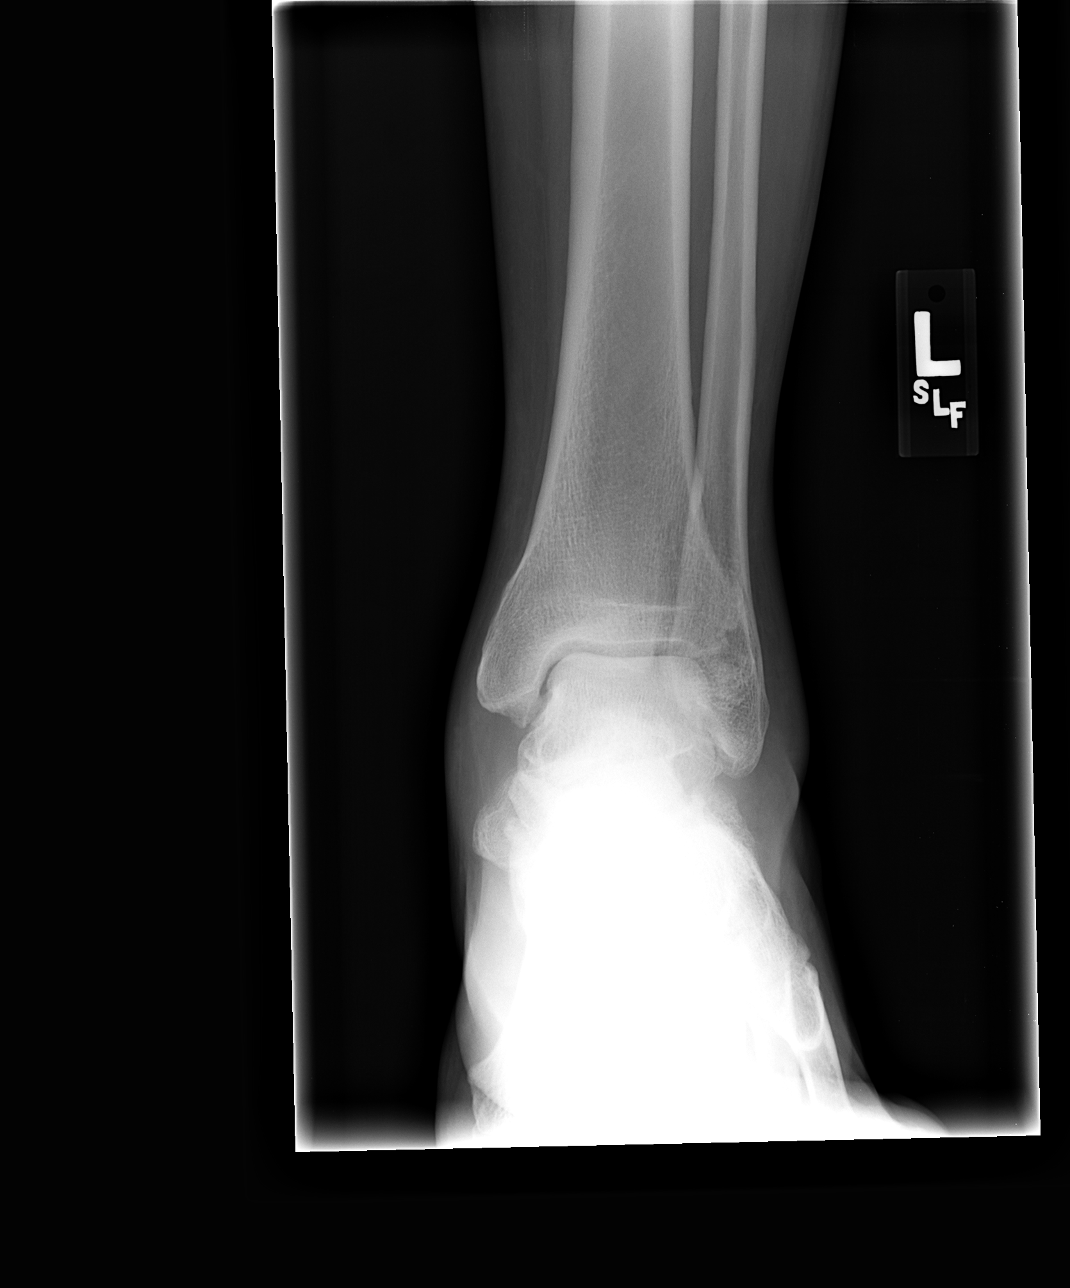

[view not recorded (2 of 3)]
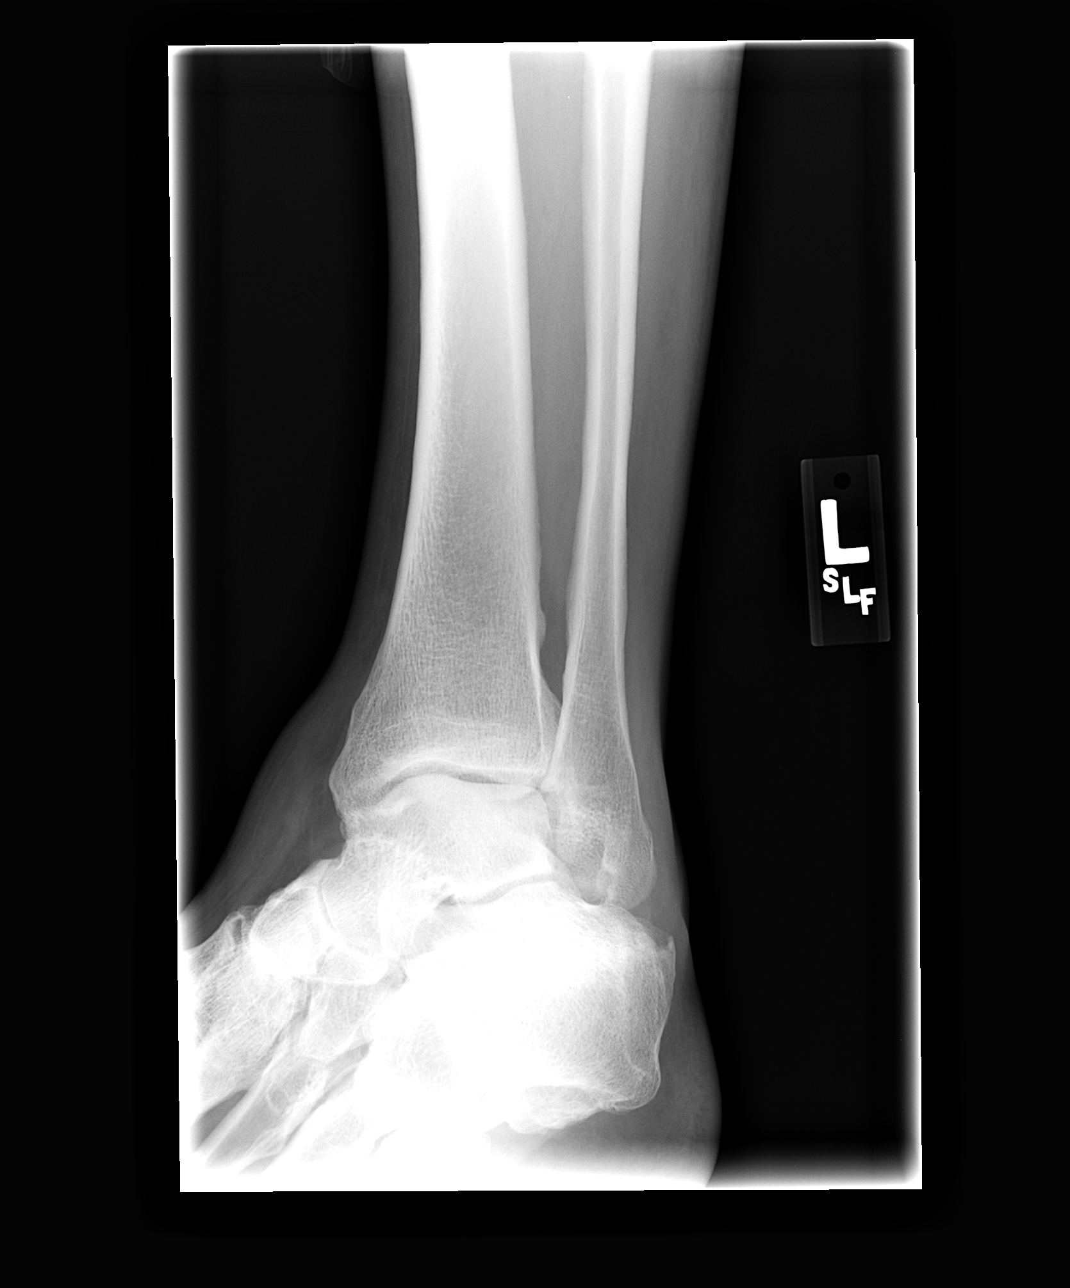

[view not recorded (3 of 3)]
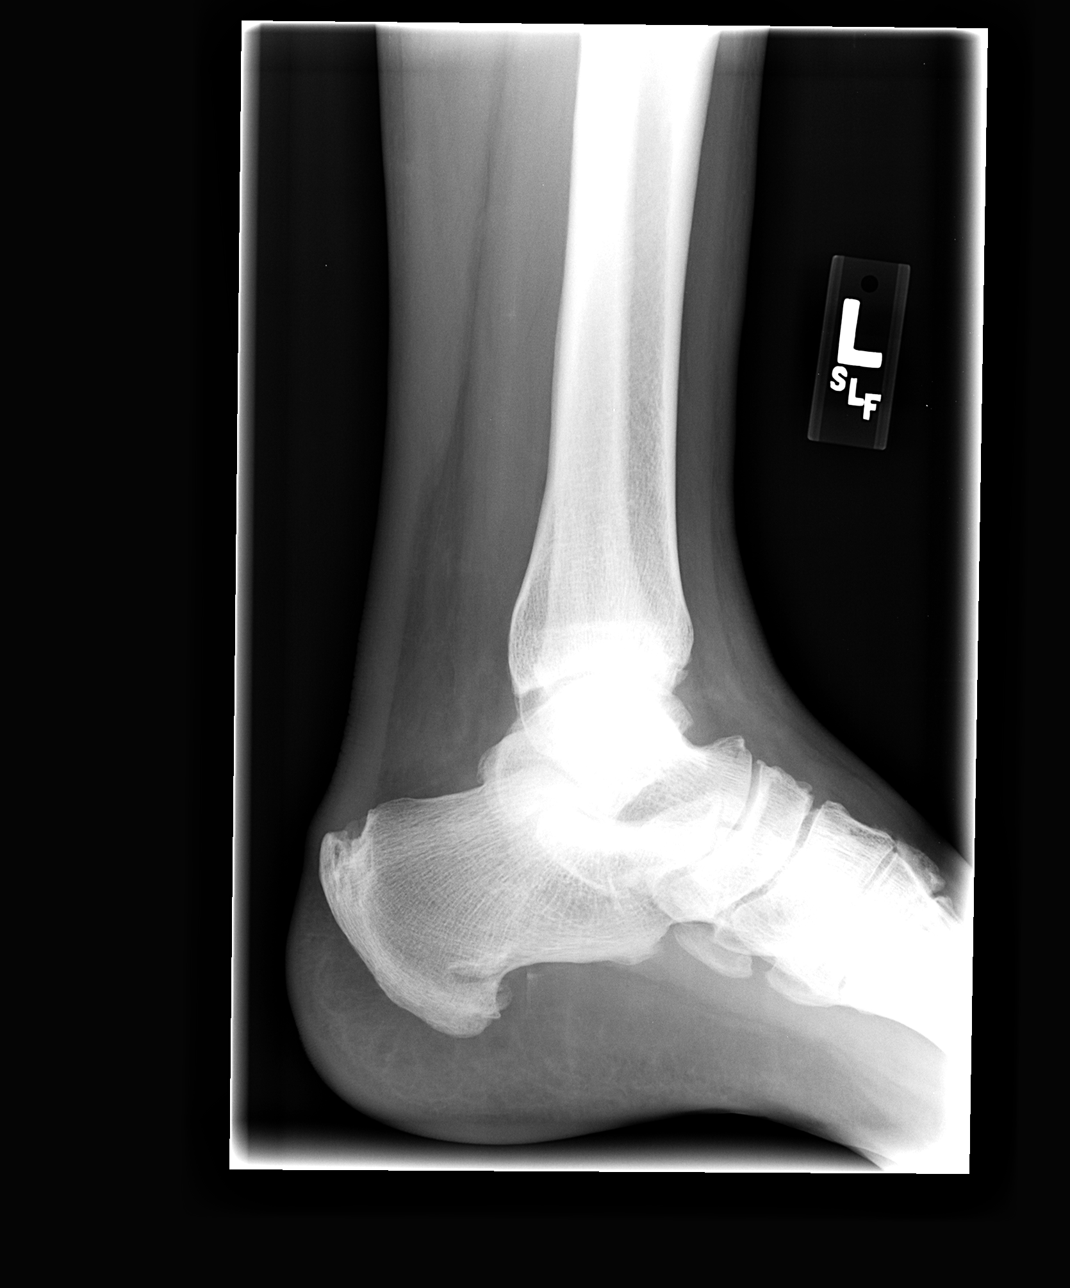

[3 of 3 positions shown; findings below may reference images not displayed]

FINDINGS: There is mild inferior spurring of the medial malleolus
along with plantar and Achilles calcaneal spurs.  As on the
contralateral side, there is a large os peroneus.  Degenerative
midfoot and Lisfranc joint spurring noted.  There may be a
chronically avulsed spur of the anterior process of the calcaneus.

No acute bony findings are identified.  No radiographic indicators
specific for gout are identified.
IMPRESSION: 1.  Spurring in the ankle without acute fracture identified.
2.  Prominent os peroneus.

## 2012-02-15 ENCOUNTER — Encounter: Payer: Self-pay | Admitting: Internal Medicine

## 2012-02-15 ENCOUNTER — Ambulatory Visit (INDEPENDENT_AMBULATORY_CARE_PROVIDER_SITE_OTHER): Payer: Self-pay | Admitting: Internal Medicine

## 2012-02-15 VITALS — BP 146/97 | HR 66 | Temp 98.2°F | Ht 72.9 in | Wt 287.7 lb

## 2012-02-15 DIAGNOSIS — E785 Hyperlipidemia, unspecified: Secondary | ICD-10-CM

## 2012-02-15 DIAGNOSIS — G4733 Obstructive sleep apnea (adult) (pediatric): Secondary | ICD-10-CM

## 2012-02-15 DIAGNOSIS — I10 Essential (primary) hypertension: Secondary | ICD-10-CM

## 2012-02-15 DIAGNOSIS — Z0000 Encounter for general adult medical examination without abnormal findings: Secondary | ICD-10-CM

## 2012-02-15 LAB — COMPLETE METABOLIC PANEL WITH GFR
ALT: 18 U/L (ref 0–53)
AST: 16 U/L (ref 0–37)
Albumin: 4.2 g/dL (ref 3.5–5.2)
Alkaline Phosphatase: 72 U/L (ref 39–117)
BUN: 10 mg/dL (ref 6–23)
Calcium: 9.2 mg/dL (ref 8.4–10.5)
Chloride: 106 mEq/L (ref 96–112)
Potassium: 4.5 mEq/L (ref 3.5–5.3)
Sodium: 141 mEq/L (ref 135–145)
Total Protein: 8.1 g/dL (ref 6.0–8.3)

## 2012-02-15 MED ORDER — PRAVASTATIN SODIUM 20 MG PO TABS: 20.0000 mg | ORAL_TABLET | Freq: Every evening | ORAL | Status: DC

## 2012-02-15 NOTE — Assessment & Plan Note (Signed)
I prescribed a CPAP machine to be titrated as per recommendations from the sleep study note. I will followup in one month to make sure patient is able to obtain and using CPAP machine well.

## 2012-02-15 NOTE — Assessment & Plan Note (Signed)
Start pravastatin 20 mg a day. Comprehensive metabolic profile today. Advised weight loss and exercise. Followup lipid panel in 6 months.

## 2012-02-15 NOTE — Progress Notes (Signed)
Patient ID: Shawn Lawrence, male   DOB: 04-May-1961, 51 y.o.   MRN: [id number]

## 2012-02-15 NOTE — Patient Instructions (Addendum)
Cervical Radiculopathy Cervical radiculopathy happens when a nerve in the neck is pinched or bruised by a slipped (herniated) disk or by arthritic changes in the bones of the cervical spine. This can occur due to an injury or as part of the normal aging process. Pressure on the cervical nerves can cause pain or numbness that runs from your neck all the way down into your arm and fingers. CAUSES  There are many possible causes, including:  Injury.   Muscle tightness in the neck from overuse.   Swollen, painful joints (arthritis).   Breakdown or degeneration in the bones and joints of the spine (spondylosis) due to aging.   Bone spurs that may develop near the cervical nerves.  SYMPTOMS  Symptoms include pain, weakness, or numbness in the affected arm and hand. Pain can be severe or irritating. Symptoms may be worse when extending or turning the neck. DIAGNOSIS  Your caregiver will ask about your symptoms and do a physical exam. He or she may test your strength and reflexes. X-rays, CT scans, and MRI scans may be needed in cases of injury or if the symptoms do not go away after a period of time. Electromyography (EMG) or nerve conduction testing may be done to study how your nerves and muscles are working. TREATMENT  Your caregiver may recommend certain exercises to help relieve your symptoms. Cervical radiculopathy can, and often does, get better with time and treatment. If your problems continue, treatment options may include:  Wearing a soft collar for short periods of time.   Physical therapy to strengthen the neck muscles.   Medicines, such as nonsteroidal anti-inflammatory drugs (NSAIDs), oral corticosteroids, or spinal injections.   Surgery. Different types of surgery may be done depending on the cause of your problems.  HOME CARE INSTRUCTIONS   Put ice on the affected area.   Put ice in a plastic bag.   Place a towel between your skin and the bag.   Leave the ice on for 15  to 20 minutes, 3 to 4 times a day or as directed by your caregiver.   Use a flat pillow when you sleep.   Only take over-the-counter or prescription medicines for pain, discomfort, or fever as directed by your caregiver.   If physical therapy was prescribed, follow your caregiver's directions.   If a soft collar was prescribed, use it as directed.  SEEK IMMEDIATE MEDICAL CARE IF:   Your pain gets much worse and cannot be controlled with medicines.   You have weakness or numbness in your hand, arm, face, or leg.   You have a high fever or a stiff, rigid neck.   You lose bowel or bladder control (incontinence).   You have trouble with walking, balance, or speaking.  MAKE SURE YOU:   Understand these instructions.   Will watch your condition.   Will get help right away if you are not doing well or get worse.  Document Released: 02/22/2001 Document Revised: 05/19/2011 Document Reviewed: 01/11/2011 [employer] Patient Information 2012 [employer]. 

## 2012-02-15 NOTE — Assessment & Plan Note (Addendum)
Patient is mildly hypertensive on maximal dose of Norvasc and lisinopril. He refuses new medications for blood pressure control at this time. I believe that untreated obstructive sleep apnea is a major contributor to his blood pressure. Once patient is able to obtain a CPAP machine I believe that his blood pressure will do better. I advised him weight loss and dietary changes. Followup in one month for repeat blood pressure. Comprehensive metabolic profile today.

## 2012-02-15 NOTE — Progress Notes (Signed)
  Subjective:    Patient ID: Shawn Lawrence, male    DOB: 02-01-1961, 51 y.o.   MRN: [id number]  HPI  Patient is a 51 year old man with past medical history most significant for hypertension, dyslipidemia and gout.  Patient complains of numbness and tingling in both his hands since last 6 months. The numbness starts from his neck and radiates equally to both his arms and hands. He does not have neck stiffness. The numbness occurs only at nighttime. Patient is able to function properly during the daytime. There is no weakness noted. Patient sleeps with 2 pillows. No family history of cervical radiculopathy.  Patient's blood pressure is slightly elevated today. He refuses addition of any blood pressure medications as he is scared of side effects.  Patient had a sleep study in March of 2013. We will prescribe him CPAP today.  Patient's cholesterol was 121 LDL.  Patient has not made any significant changes in his diet and does not exercise at this time.    Review of Systems  Constitutional: Negative for fever, activity change and appetite change.  HENT: Negative for sore throat.   Respiratory: Negative for cough and shortness of breath.   Cardiovascular: Negative for chest pain and leg swelling.  Gastrointestinal: Negative for nausea, abdominal pain, diarrhea, constipation and abdominal distention.  Genitourinary: Negative for frequency, hematuria and difficulty urinating.  Neurological: Negative for dizziness and headaches.  Psychiatric/Behavioral: Negative for suicidal ideas and behavioral problems.   As above    Objective:   Physical Exam  Constitutional: He is oriented to person, place, and time. He appears well-developed and well-nourished.  HENT:  Head: Normocephalic and atraumatic.  Eyes: Conjunctivae and EOM are normal. Pupils are equal, round, and reactive to light. No scleral icterus.  Neck: Normal range of motion. Neck supple. No JVD present. No thyromegaly present.    Cardiovascular: Normal rate, regular rhythm, normal heart sounds and intact distal pulses.  Exam reveals no gallop and no friction rub.   No murmur heard. Pulmonary/Chest: Effort normal and breath sounds normal. No respiratory distress. He has no wheezes. He has no rales.  Abdominal: Soft. Bowel sounds are normal. He exhibits no distension and no mass. There is no tenderness. There is no rebound and no guarding.  Musculoskeletal: Normal range of motion. He exhibits no edema and no tenderness.  Lymphadenopathy:    He has no cervical adenopathy.  Neurological: He is alert and oriented to person, place, and time. He has normal reflexes. He displays normal reflexes. No cranial nerve deficit. He exhibits normal muscle tone. Coordination normal.  Psychiatric: He has a normal mood and affect. His behavior is normal.          Assessment & Plan:

## 2012-02-15 NOTE — Assessment & Plan Note (Signed)
Given Hemoccult cards today. Shawn Lawrence was told to set up a colonoscopy appointment for him. Up-to-date on other health maintenance.

## 2012-07-13 ENCOUNTER — Encounter: Payer: Self-pay | Admitting: Internal Medicine

## 2012-07-24 ENCOUNTER — Ambulatory Visit: Payer: Self-pay

## 2012-09-24 ENCOUNTER — Other Ambulatory Visit: Payer: Self-pay | Admitting: *Deleted

## 2012-09-24 DIAGNOSIS — I10 Essential (primary) hypertension: Secondary | ICD-10-CM

## 2012-09-25 NOTE — Telephone Encounter (Signed)
review 

## 2012-09-26 MED ORDER — LISINOPRIL 40 MG PO TABS: 40.0000 mg | ORAL_TABLET | Freq: Every day | ORAL | Status: DC

## 2012-09-26 MED ORDER — AMLODIPINE BESYLATE 10 MG PO TABS: 10.0000 mg | ORAL_TABLET | Freq: Every day | ORAL | Status: DC

## 2012-09-26 NOTE — Telephone Encounter (Signed)
Rx called in to pharmacy. 

## 2012-09-26 NOTE — Telephone Encounter (Signed)
Patient is overdue for appointment.  Please notify him that today's refill is for 1 month only and he must be seen. Please phone in 2 scripts.

## 2012-11-07 ENCOUNTER — Other Ambulatory Visit: Payer: Self-pay | Admitting: *Deleted

## 2012-11-07 DIAGNOSIS — I10 Essential (primary) hypertension: Secondary | ICD-10-CM

## 2012-11-08 ENCOUNTER — Telehealth: Payer: Self-pay | Admitting: *Deleted

## 2012-11-08 MED ORDER — AMLODIPINE BESYLATE 10 MG PO TABS: 10.0000 mg | ORAL_TABLET | Freq: Every day | ORAL | Status: DC

## 2012-11-08 MED ORDER — LISINOPRIL 40 MG PO TABS: 40.0000 mg | ORAL_TABLET | Freq: Every day | ORAL | Status: DC

## 2012-11-08 NOTE — Telephone Encounter (Signed)
I have not seen patient since Jul 26, 2011. Patient is scheduled for appt on Monday per Rosanne Ashing

## 2012-11-08 NOTE — Telephone Encounter (Signed)
Pt calls to check on his refills and he is ask to schedule an appt and appt is offered, he becomes angry because nurse cannot do it now, states he has moved and has not changed his address with the clinic, the address is now changed. He tells nurse that he spoke to someone at 1600 5/28 and that person said they would call it in then. He does not know who this person is. Nurse explained 48hr policy for refills and informed pt that nurse will ask for refill until his appt. He becomes angry and wants to speak to someone in charge, transferred to jim shaw

## 2012-11-08 NOTE — Telephone Encounter (Signed)
Call transferred to me after Mr. Shawn Lawrence. Spoke with refill nurse, complaining about our not refilling his meds for bp. He was told last refill and sent a letter (wrong address charted) that he needed a visit. I made him an appointment with Dr. Dierdre Searles at 1330 Monday. He is going to [place] and back on Sunday. Address corrected in chart.

## 2012-11-08 NOTE — Telephone Encounter (Signed)
Needs an appointment before more refills, I am not familiar with this patient. Please page Dr. Anselm Jungling who is his PCP.

## 2012-11-09 NOTE — Telephone Encounter (Signed)
Rx faxed in.

## 2012-11-12 ENCOUNTER — Ambulatory Visit (INDEPENDENT_AMBULATORY_CARE_PROVIDER_SITE_OTHER): Payer: No Typology Code available for payment source | Admitting: Internal Medicine

## 2012-11-12 ENCOUNTER — Encounter: Payer: Self-pay | Admitting: Internal Medicine

## 2012-11-12 VITALS — BP 165/108 | HR 61 | Temp 97.3°F | Wt 289.1 lb

## 2012-11-12 DIAGNOSIS — E785 Hyperlipidemia, unspecified: Secondary | ICD-10-CM

## 2012-11-12 DIAGNOSIS — I10 Essential (primary) hypertension: Secondary | ICD-10-CM

## 2012-11-12 LAB — BASIC METABOLIC PANEL WITH GFR
BUN: 7 mg/dL (ref 6–23)
CO2: 26 mEq/L (ref 19–32)
Calcium: 9 mg/dL (ref 8.4–10.5)
Chloride: 104 mEq/L (ref 96–112)
Creat: 0.81 mg/dL (ref 0.50–1.35)
GFR, Est African American: >89 mL/min
GFR, Est Non African American: >89 mL/min
Glucose, Bld: 81 mg/dL (ref 70–99)
Potassium: 4.8 mEq/L (ref 3.5–5.3)
Sodium: 138 mEq/L (ref 135–145)

## 2012-11-12 LAB — LIPID PANEL
Cholesterol: 144 mg/dL (ref 0–200)
HDL: 29 mg/dL — ABNORMAL LOW (ref >39)
LDL Cholesterol: 87 mg/dL (ref 0–99)
Total CHOL/HDL Ratio: 5 Ratio
Triglycerides: 139 mg/dL (ref <150)
VLDL: 28 mg/dL (ref 0–40)

## 2012-11-12 LAB — POCT GLYCOSYLATED HEMOGLOBIN (HGB A1C): Hemoglobin A1C: 5.4

## 2012-11-12 MED ORDER — LISINOPRIL 40 MG PO TABS: 40.0000 mg | ORAL_TABLET | Freq: Every day | ORAL | Status: DC

## 2012-11-12 MED ORDER — AMLODIPINE BESYLATE 10 MG PO TABS: 10.0000 mg | ORAL_TABLET | Freq: Every day | ORAL | Status: DC

## 2012-11-12 NOTE — Progress Notes (Signed)
Subjective:   Patient ID: Shawn Lawrence male   DOB: 09-Nov-1960 52 y.o.   MRN: [id number]  HPI: Shawn Lawrence is a 52 y.o. man with PMH of HTN, HLD, Gout and obesity who presents to the clinic for medication refills.  # HTN Patient has a chronic history of HTN, and he is on lisinopril and Norvasc. Per chart review, patient had cancelled his office appointment or no shows multiple times in the past. He only showed up for two office visits last year, and has not seen any physician this year. Patient was asked to come to the follow up when he asked for medication refills.   Patient states that he has been off antihypertensive meds for ~ 5 days. He is noted to be mild-moderate hypertensive with BP 165/108. Denies headache or new visional change( reports chronic vision change for 1-2 years and has not seen an ophthalmologist for 4 years). No other discomfort.   # HLD Refused to take any statin because "I am afraid of side effects".   Past Medical History  Diagnosis Date  . Hypertension   . Obesity   . Abscess 10/2004 s/p I and D    peritonsilar  . 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 Gout     no aspiration   Current Outpatient Prescriptions  Medication Sig Dispense Refill  . amLODipine (NORVASC) 10 MG tablet Take 1 tablet (10 mg total) by mouth daily.  30 tablet  0  . lisinopril (PRINIVIL,ZESTRIL) 40 MG tablet Take 1 tablet (40 mg total) by mouth daily.  30 tablet  0  . pravastatin (PRAVACHOL) 20 MG tablet Take 1 tablet (20 mg total) by mouth every evening.  30 tablet  2   No current facility-administered medications for this visit.   Family History  Problem Relation Age of Onset  . Diabetes Mother    History   Social History  . Marital Status: Married    Spouse Name: N/A    Number of Children: N/A  . Years of Education: N/A   Social History Main Topics  . Smoking status: Never Smoker   . Smokeless tobacco: None  . Alcohol Use: No  . Drug Use: No  .  Sexually Active: None   Other Topics Concern  . None   Social History Narrative   Occuaption: accounting, full-time   Married   Most of family has HTN   Review of Systems: Review of Systems:  Constitutional:  Denies fever, chills, diaphoresis, appetite change and fatigue.   HEENT:  Denies congestion, sore throat, rhinorrhea, sneezing, mouth sores, trouble swallowing, neck pain   Respiratory:  Denies SOB, DOE, cough, and wheezing.   Cardiovascular:  Denies palpitations and leg swelling.   Gastrointestinal:  Denies nausea, vomiting, abdominal pain, diarrhea, constipation, blood in stool and abdominal distention.   Genitourinary:  Denies dysuria, urgency, frequency, hematuria, flank pain and difficulty urinating.   Musculoskeletal:  Denies myalgias, back pain, joint swelling, arthralgias and gait problem.   Skin:  Denies pallor, rash and wound.   Neurological:  Denies dizziness, seizures, syncope, weakness, light-headedness, numbness and headaches.    .    Objective:  Physical Exam: Filed Vitals:   11/12/12 1347  BP: 165/108  Pulse: 61  Temp: 97.3 F (36.3 C)  TempSrc: Oral  Weight: 289 lb 1.6 oz (131.135 kg)  SpO2: 99%   General: alert, well-developed, and cooperative to examination.  Head: normocephalic and atraumatic.  Eyes: vision grossly intact, pupils equal, pupils  round, pupils reactive to light, no injection and anicteric.  Mouth: pharynx pink and moist, no erythema, and no exudates.  Neck: supple, full ROM, no thyromegaly, no JVD, and no carotid bruits.  Lungs: normal respiratory effort, no accessory muscle use, normal breath sounds, no crackles, and no wheezes. Heart: normal rate, regular rhythm, no murmur, no gallop, and no rub.  Abdomen: soft, non-tender, normal bowel sounds, no distention, no guarding, no rebound tenderness, no hepatomegaly, and no splenomegaly.  Msk: no joint swelling, no joint warmth, and no redness over joints.  Pulses: 2+ DP/PT pulses  bilaterally Extremities: No cyanosis, clubbing, edema Neurologic: alert & oriented X3, cranial nerves II-XII intact, strength normal in all extremities, sensation intact to light touch, and gait normal.  Skin: turgor normal and no rashes.  Psych: Oriented X3, memory intact for recent and remote, normally interactive, good eye contact, not anxious appearing, and not depressed appearing.     Assessment & Plan:

## 2012-11-12 NOTE — Assessment & Plan Note (Addendum)
Not taking his statin because " I am afraid of side effects".  - he will need a lot of educations on his medications - will repeat his lipid panel and follow up in one month - will need close follow up and further discussion of medical treatment once lipid panel is resulted.   Addendum - LDL 87 at goal. ( 2+ risk factors and 10 year risk is 8%--goal of LDL < 130)

## 2012-11-12 NOTE — Assessment & Plan Note (Addendum)
Patient is noted to be hypertensive at the clinic today. He reports medical compliance with his medications except for missing his meds for last 5 days.   Of note, he is noted to have multiple no shows in the past.  He explained to me that he did not want to see different physicians during his visits.   - will give her refills on Norvasc and lisinopril  - he refused to take statin - close follow up. - I have asked patient to call the triage nurse to report his BP in 1-2 weeks.   BP Readings from Last 3 Encounters:  11/12/12 165/108  02/15/12 146/97  07/26/11 142/96    Lab Results  Component Value Date   Yukari Flax 141 02/15/2012   K 4.5 02/15/2012   CREATININE 0.82 02/15/2012    Assessment: Blood pressure control: moderately elevated Progress toward BP goal:  deteriorated Comments:   Plan: Medications:  continue current medications Educational resources provided: brochure;handout;video Self management tools provided: home blood pressure logbook;home blood pressure meter Other plans:

## 2012-11-12 NOTE — Patient Instructions (Addendum)
General Instructions:  1. Follow up in one month 2. Call me in 1-2 weeks and report your blood pressure to the triage nurse. [phone].  Treatment Goals:  Goals (1 Years of Data) as of 11/12/12     Diet    . Have 3 meals a day     . Reduce calorie intake to 2000 calories per day      Exercise    . Exercise 10000 steps per day        Progress Toward Treatment Goals:  Treatment Goal 11/12/2012  Blood pressure deteriorated    Self Care Goals & Plans:  Self Care Goal 11/12/2012  Manage my medications take my medicines as prescribed; bring my medications to every visit; refill my medications on time; follow the sick day instructions if I am sick  Monitor my health keep track of my blood pressure; keep track of my weight  Eat healthy foods eat more vegetables; eat fruit for snacks and desserts; eat smaller portions; eat baked foods instead of fried foods; eat foods that are low in salt; drink diet soda or water instead of juice or soda  Be physically active take a walk every day; find an activity I enjoy  Meeting treatment goals maintain the current self-care plan       Care Management & Community Referrals:  Referral 11/12/2012  Referrals made for care management support social worker  Referrals made to community resources weight management

## 2012-11-13 NOTE — Progress Notes (Signed)
Case discussed with Dr. Li (at time of visit, soon after the resident saw the patient).  We reviewed the resident's history and exam and pertinent patient test results.  I agree with the assessment, diagnosis, and plan of care documented in the resident's note.  

## 2013-02-18 ENCOUNTER — Ambulatory Visit: Payer: Self-pay

## 2013-04-18 ENCOUNTER — Other Ambulatory Visit: Payer: Self-pay

## 2013-09-03 ENCOUNTER — Ambulatory Visit: Payer: Self-pay

## 2013-09-25 ENCOUNTER — Ambulatory Visit: Payer: Self-pay

## 2013-10-01 ENCOUNTER — Encounter: Payer: Self-pay | Admitting: Internal Medicine

## 2013-10-29 ENCOUNTER — Encounter: Payer: Self-pay | Admitting: Internal Medicine

## 2013-10-29 ENCOUNTER — Ambulatory Visit (INDEPENDENT_AMBULATORY_CARE_PROVIDER_SITE_OTHER): Payer: No Typology Code available for payment source | Admitting: Internal Medicine

## 2013-10-29 VITALS — BP 159/105 | HR 71 | Temp 98.9°F | Ht 73.0 in | Wt 275.4 lb

## 2013-10-29 DIAGNOSIS — I10 Essential (primary) hypertension: Secondary | ICD-10-CM

## 2013-10-29 DIAGNOSIS — E785 Hyperlipidemia, unspecified: Secondary | ICD-10-CM

## 2013-10-29 DIAGNOSIS — Z0000 Encounter for general adult medical examination without abnormal findings: Secondary | ICD-10-CM

## 2013-10-29 MED ORDER — METOPROLOL TARTRATE 50 MG PO TABS: 50.0000 mg | ORAL_TABLET | Freq: Two times a day (BID) | ORAL | Status: DC

## 2013-10-29 NOTE — Assessment & Plan Note (Signed)
Referral placed [facility] for screening colonoscopy.

## 2013-10-29 NOTE — Progress Notes (Signed)
INTERNAL MEDICINE TEACHING ATTENDING ADDENDUM - Dejon Jungman, MD: I reviewed and discussed with the resident Dr. Cater, the patient's medical history, physical examination, diagnosis and results of pertinent tests and treatment and I agree with the patient's care as documented. 

## 2013-10-29 NOTE — Patient Instructions (Addendum)
Thank you for your visit. - Please take metoprolol 50 mg twice daily, one in the morning and one in the evening. This is a new blood pressure medication. The main side effect is it can sometimes slow your heart rate and cause fatigue. Please call the clinic if the side effect happens. [facility]. - I have placed a referral to a gastroenterologist for you to get your screening colonoscopy. Rivka BarbaraGlenda will talk to you more about scheduling the appointment. - Please return to see me in 3 months so we can check your blood pressure again.

## 2013-10-29 NOTE — Assessment & Plan Note (Addendum)
BP Readings from Last 3 Encounters:  10/29/13 159/105  11/12/12 165/108  02/15/12 146/97    Lab Results  Component Value Date   NA 138 11/12/2012   K 4.8 11/12/2012   CREATININE 0.81 11/12/2012    Assessment: Blood pressure control: moderately elevated Progress toward BP goal:  unchanged Comments: Reports medication compliance.  Plan: Medications:  continue current medications (amlodipine 10 mg daily, lisinopril 40 mg daily), add metoprolol 50 mg twice a day ([facility]). Other plans: Followup in 3 months for BP check.

## 2013-10-29 NOTE — Progress Notes (Signed)
   Subjective:    Patient ID: Shawn Lawrence, male    DOB: Nov 08, 1960, 53 y.o.   MRN: [id number]  HPI  Shawn Lawrence is a 53 y.o. male PMH HTN, HLD, gout and obesity who presents to the clinic for routine follow up.  HTN - This is his first follow up appointment in a year. He has missed three appointments since he was last seen in 06/14. BP is elevated today at 160/100. He says it is usually lower when he takes it at home, typically around 140/80. He reports compliance with his amlodipine and lisinopril. He recalls he used to be on HCTZ but this was discontinued due to a flareup of gout.  HLD - Patient is not compliant with his pravastatin due to concern for liver side effects.  Health maintenance - He is due for a colonoscopy. No family history of colon cancer.   Current Outpatient Prescriptions on File Prior to Visit  Medication Sig Dispense Refill  . amLODipine (NORVASC) 10 MG tablet Take 1 tablet (10 mg total) by mouth daily.  90 tablet  4  . lisinopril (PRINIVIL,ZESTRIL) 40 MG tablet Take 1 tablet (40 mg total) by mouth daily.  90 tablet  4  . pravastatin (PRAVACHOL) 20 MG tablet Take 1 tablet (20 mg total) by mouth every evening.  30 tablet  2    Review of Systems  Constitutional: Negative for fever and chills.  Eyes: Positive for visual disturbance (Eyes get tired when looking at the computer for too long. No consistent blurry vision.).  Respiratory: Negative for shortness of breath.   Cardiovascular: Negative for chest pain.  Gastrointestinal: Negative for abdominal pain.  Neurological: Negative for weakness and numbness.       Objective:   Physical Exam  Constitutional: He is oriented to person, place, and time. He appears well-developed and well-nourished.  HENT:  Head: Normocephalic and atraumatic.  Eyes: Conjunctivae and EOM are normal. Pupils are equal, round, and reactive to light.  Neck: Normal range of motion. Neck supple.  Cardiovascular: Normal  rate and regular rhythm.   No murmur heard. Pulmonary/Chest: Effort normal and breath sounds normal.  Abdominal: Soft. He exhibits no distension. There is no tenderness.  Musculoskeletal: Normal range of motion. He exhibits no edema and no tenderness.  Neurological: He is alert and oriented to person, place, and time. No cranial nerve deficit.  Skin: Skin is warm and dry.  Psychiatric: He has a normal mood and affect.     Filed Vitals:   10/29/13 0932  BP: 159/105  Pulse: 71  Temp: 98.9 F (37.2 C)   Manual BP 160/100    Assessment & Plan:   Please see problem based charting.

## 2013-10-29 NOTE — Assessment & Plan Note (Signed)
10-Year ASCVD Risk 8.8%. He is not a smoker. Adults 5140 to 53 years of age with LDL-C 70 to 189 mg/dL, without clinical ASCVD or diabetes and an estimated 10-year ASCVD risk ?7.5% should be treated with moderate- to high-intensity statin therapy. Patient is not compliant at this time due to concern for liver side effects.  - Will continue to discuss with him at future visit.

## 2013-11-19 ENCOUNTER — Encounter: Payer: Self-pay | Admitting: *Deleted

## 2013-11-20 ENCOUNTER — Other Ambulatory Visit: Payer: Self-pay | Admitting: *Deleted

## 2013-11-20 DIAGNOSIS — I10 Essential (primary) hypertension: Secondary | ICD-10-CM

## 2013-11-20 MED ORDER — AMLODIPINE BESYLATE 10 MG PO TABS: 10.0000 mg | ORAL_TABLET | Freq: Every day | ORAL | Status: DC

## 2013-11-20 MED ORDER — LISINOPRIL 40 MG PO TABS: 40.0000 mg | ORAL_TABLET | Freq: Every day | ORAL | Status: DC

## 2013-11-20 NOTE — Telephone Encounter (Signed)
I have ordered these FAXED as pharmacy is [facility], thanks.  Maralyn Sago

## 2013-11-20 NOTE — Telephone Encounter (Signed)
Both Rx called in to pharmacy. 

## 2013-12-05 ENCOUNTER — Encounter: Payer: Self-pay | Admitting: Internal Medicine

## 2013-12-19 NOTE — Addendum Note (Signed)
Addended by: Neomia DearPOWERS, Nathanal Hermiz E on: 12/19/2013 06:41 PM   Modules accepted: Orders

## 2014-02-24 ENCOUNTER — Encounter: Payer: No Typology Code available for payment source | Admitting: Internal Medicine

## 2014-03-17 NOTE — Progress Notes (Signed)
This encounter was created in error - please disregard.

## 2014-03-28 ENCOUNTER — Other Ambulatory Visit: Payer: Self-pay

## 2014-11-27 ENCOUNTER — Ambulatory Visit (INDEPENDENT_AMBULATORY_CARE_PROVIDER_SITE_OTHER): Payer: Self-pay | Admitting: Internal Medicine

## 2014-11-27 ENCOUNTER — Encounter: Payer: Self-pay | Admitting: Internal Medicine

## 2014-11-27 VITALS — BP 163/109 | HR 70 | Temp 97.8°F | Ht 73.0 in | Wt 283.7 lb

## 2014-11-27 DIAGNOSIS — E785 Hyperlipidemia, unspecified: Secondary | ICD-10-CM

## 2014-11-27 DIAGNOSIS — I10 Essential (primary) hypertension: Secondary | ICD-10-CM

## 2014-11-27 DIAGNOSIS — Z0000 Encounter for general adult medical examination without abnormal findings: Secondary | ICD-10-CM

## 2014-11-27 LAB — COMPLETE METABOLIC PANEL WITH GFR
ALK PHOS: 76 U/L (ref 39–117)
ALT: 19 U/L (ref 0–53)
AST: 18 U/L (ref 0–37)
Albumin: 4 g/dL (ref 3.5–5.2)
BILIRUBIN TOTAL: 0.4 mg/dL (ref 0.2–1.2)
BUN: 10 mg/dL (ref 6–23)
CALCIUM: 8.9 mg/dL (ref 8.4–10.5)
CO2: 24 mEq/L (ref 19–32)
Chloride: 107 mEq/L (ref 96–112)
Creat: 0.75 mg/dL (ref 0.50–1.35)
GFR, Est African American: >89 mL/min
GFR, Est Non African American: >89 mL/min
GLUCOSE: 93 mg/dL (ref 70–99)
Potassium: 4.1 mEq/L (ref 3.5–5.3)
SODIUM: 140 meq/L (ref 135–145)
TOTAL PROTEIN: 7.9 g/dL (ref 6.0–8.3)

## 2014-11-27 LAB — LIPID PANEL
CHOL/HDL RATIO: 5.7 ratio
Cholesterol: 170 mg/dL (ref 0–200)
HDL: 30 mg/dL — ABNORMAL LOW (ref >=40)
LDL CALC: 121 mg/dL — AB (ref 0–99)
Triglycerides: 96 mg/dL (ref <150)
VLDL: 19 mg/dL (ref 0–40)

## 2014-11-27 MED ORDER — LISINOPRIL 40 MG PO TABS: 40.0000 mg | ORAL_TABLET | Freq: Every day | ORAL | Status: DC

## 2014-11-27 MED ORDER — METOPROLOL TARTRATE 50 MG PO TABS: 50.0000 mg | ORAL_TABLET | Freq: Two times a day (BID) | ORAL | Status: DC

## 2014-11-27 MED ORDER — AMLODIPINE BESYLATE 10 MG PO TABS: 10.0000 mg | ORAL_TABLET | Freq: Every day | ORAL | Status: DC

## 2014-11-27 NOTE — Patient Instructions (Signed)
General Instructions: Please follow up in 1 month for blood pressure  We will check labs today liver, kidneys and cholesterol    Hypertension Hypertension, commonly called high blood pressure, is when the force of blood pumping through your arteries is too strong. Your arteries are the blood vessels that carry blood from your heart throughout your body. A blood pressure reading consists of a higher number over a lower number, such as 110/72. The higher number (systolic) is the pressure inside your arteries when your heart pumps. The lower number (diastolic) is the pressure inside your arteries when your heart relaxes. Ideally you want your blood pressure below 120/80. Hypertension forces your heart to work harder to pump blood. Your arteries may become narrow or stiff. Having hypertension puts you at risk for heart disease, stroke, and other problems.  RISK FACTORS Some risk factors for high blood pressure are controllable. Others are not.  Risk factors you cannot control include:   Race. You may be at higher risk if you are African American.  Age. Risk increases with age.  Gender. Men are at higher risk than women before age 57 years. After age 49, women are at higher risk than men. Risk factors you can control include:  Not getting enough exercise or physical activity.  Being overweight.  Getting too much fat, sugar, calories, or salt in your diet.  Drinking too much alcohol. SIGNS AND SYMPTOMS Hypertension does not usually cause signs or symptoms. Extremely high blood pressure (hypertensive crisis) may cause headache, anxiety, shortness of breath, and nosebleed. DIAGNOSIS  To check if you have hypertension, your health care provider will measure your blood pressure while you are seated, with your arm held at the level of your heart. It should be measured at least twice using the same arm. Certain conditions can cause a difference in blood pressure between your right and left arms. A  blood pressure reading that is higher than normal on one occasion does not mean that you need treatment. If one blood pressure reading is high, ask your health care provider about having it checked again. TREATMENT  Treating high blood pressure includes making lifestyle changes and possibly taking medicine. Living a healthy lifestyle can help lower high blood pressure. You may need to change some of your habits. Lifestyle changes may include:  Following the DASH diet. This diet is high in fruits, vegetables, and whole grains. It is low in salt, red meat, and added sugars.  Getting at least 2 hours of brisk physical activity every week.  Losing weight if necessary.  Not smoking.  Limiting alcoholic beverages.  Learning ways to reduce stress. If lifestyle changes are not enough to get your blood pressure under control, your health care provider may prescribe medicine. You may need to take more than one. Work closely with your health care provider to understand the risks and benefits. HOME CARE INSTRUCTIONS  Have your blood pressure rechecked as directed by your health care provider.   Take medicines only as directed by your health care provider. Follow the directions carefully. Blood pressure medicines must be taken as prescribed. The medicine does not work as well when you skip doses. Skipping doses also puts you at risk for problems.   Do not smoke.   Monitor your blood pressure at home as directed by your health care provider. SEEK MEDICAL CARE IF:   You think you are having a reaction to medicines taken.  You have recurrent headaches or feel dizzy.  You have  swelling in your ankles.  You have trouble with your vision. SEEK IMMEDIATE MEDICAL CARE IF:  You develop a severe headache or confusion.  You have unusual weakness, numbness, or feel faint.  You have severe chest or abdominal pain.  You vomit repeatedly.  You have trouble breathing. MAKE SURE YOU:    Understand these instructions.  Will watch your condition.  Will get help right away if you are not doing well or get worse. Document Released: 05/30/2005 Document Revised: 10/14/2013 Document Reviewed: 03/22/2013 [facility] Patient Information 2015 [place], [place]. This information is not intended to replace advice given to you by your health care provider. Make sure you discuss any questions you have with your health care provider.   Treatment Goals:  Goals (1 Years of Data) as of 11/27/14          11/12/12     Diet   . Have 3 meals a day     . Reduce calorie intake to 2000 calories per day       Exercise   . Exercise 10000 steps per day        Result Component   . LDL CALC < 130  87      Progress Toward Treatment Goals:  Treatment Goal 11/27/2014  Blood pressure deteriorated    Self Care Goals & Plans:  Self Care Goal 11/27/2014  Manage my medications take my medicines as prescribed; bring my medications to every visit; refill my medications on time; follow the sick day instructions if I am sick  Monitor my health keep track of my blood pressure  Eat healthy foods drink diet soda or water instead of juice or soda; eat more vegetables; eat foods that are low in salt; eat baked foods instead of fried foods; eat fruit for snacks and desserts; eat smaller portions  Be physically active find an activity I enjoy  Meeting treatment goals maintain the current self-care plan    No flowsheet data found.   Care Management & Community Referrals:  Referral 11/27/2014  Referrals made for care management support none needed  Referrals made to community resources none

## 2014-11-28 ENCOUNTER — Encounter: Payer: Self-pay | Admitting: Internal Medicine

## 2014-11-28 NOTE — Progress Notes (Signed)
   Subjective:    Patient ID: Shawn Lawrence, male    DOB: October 09, 1960, 54 y.o.   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 He ran out of his BP meds Norvasc 10, Lisinopril 40, Lopressor 50 mg bid x 1 week and needs Rx refills. BP today is 163/109. Denies h/a chest pain, sob  HM-declines colonoscopy and stool cards.      SH-he states his older brother is cardiologist in [place]     Review of Systems  Respiratory: Negative for shortness of breath.   Cardiovascular: Negative for chest pain.  Neurological: Negative for headaches.       Objective:   Physical Exam  Constitutional: He is oriented to person, place, and time. He appears well-developed and well-nourished. He is cooperative.  HENT:  Head: Normocephalic and atraumatic.  Mouth/Throat: No oropharyngeal exudate.  Eyes: Conjunctivae are normal. Right eye exhibits no discharge. Left eye exhibits no discharge. No scleral icterus.  Cardiovascular: Normal rate, regular rhythm and normal heart sounds.   No murmur heard. Pulmonary/Chest: Effort normal and breath sounds normal.  Abdominal: Soft. Bowel sounds are normal. He exhibits no distension. There is no tenderness.  Musculoskeletal: He exhibits no edema.  Neurological: He is alert and oriented to person, place, and time. Gait normal.  CN 2-12 grossly intact   Skin: Skin is warm and dry. No rash noted.  Psychiatric: He has a normal mood and affect. His speech is normal and behavior is normal. Judgment and thought content normal. Cognition and memory are normal.  Nursing note and vitals reviewed.         Assessment & Plan:  F/u 1 month BP

## 2014-11-29 NOTE — Assessment & Plan Note (Addendum)
Lipid Panel     Component Value Date/Time   CHOL 170 11/27/2014 1555   TRIG 96 11/27/2014 1555   HDL 30* 11/27/2014 1555   CHOLHDL 5.7 11/27/2014 1555   VLDL 19 11/27/2014 1555   LDLCALC 121* 11/27/2014 1555   Pt never started Pravastatin b/c he was concerned about side effects of the medication though he never had any b/c he never took the medication Will give info on diet and exercise  ASCVD score still >7.5 % and is 11.8% 10 year risk will try to encourage pt to take statin trial and address at f/u in 1 month

## 2014-11-29 NOTE — Assessment & Plan Note (Signed)
BP Readings from Last 3 Encounters:  11/27/14 163/109  10/29/13 159/105  11/12/12 165/108    Lab Results  Component Value Date   NA 140 11/27/2014   K 4.1 11/27/2014   CREATININE 0.75 11/27/2014    Assessment: Blood pressure control: moderately elevated Progress toward BP goal:  deteriorated Comments: BP uncontrolled out of meds   Plan: Medications:  continue current medications Rx refilled Norvasc 10, Lisinopril 40 mg, Lopressor 50 mg bid x 3 month supply. Pt to f/u in 1 month to f/u on BP Educational resources provided: handout Other plans: f/u in 1 month, check CMET, lipid panel today. Pt does not want to take Pravastatin b/c of potential side effects and has not been taking the medication

## 2014-11-29 NOTE — Assessment & Plan Note (Signed)
Declines stool cards and colonoscopy today

## 2014-12-01 ENCOUNTER — Encounter: Payer: Self-pay | Admitting: Internal Medicine

## 2014-12-02 ENCOUNTER — Other Ambulatory Visit: Payer: Self-pay | Admitting: Internal Medicine

## 2014-12-02 MED ORDER — PRAVASTATIN SODIUM 40 MG PO TABS: 40.0000 mg | ORAL_TABLET | Freq: Every evening | ORAL | Status: DC

## 2014-12-02 NOTE — Progress Notes (Signed)
Internal Medicine Clinic Attending  Case discussed with Dr. McLean soon after the resident saw the patient.  We reviewed the resident's history and exam and pertinent patient test results.  I agree with the assessment, diagnosis, and plan of care documented in the resident's note. 

## 2014-12-29 ENCOUNTER — Encounter: Payer: Self-pay | Admitting: Internal Medicine

## 2015-02-10 ENCOUNTER — Ambulatory Visit (INDEPENDENT_AMBULATORY_CARE_PROVIDER_SITE_OTHER): Payer: Self-pay | Admitting: Internal Medicine

## 2015-02-10 ENCOUNTER — Encounter: Payer: Self-pay | Admitting: Internal Medicine

## 2015-02-10 VITALS — BP 154/91 | HR 70 | Temp 98.1°F | Ht 72.0 in | Wt 285.1 lb

## 2015-02-10 DIAGNOSIS — Z0000 Encounter for general adult medical examination without abnormal findings: Secondary | ICD-10-CM

## 2015-02-10 DIAGNOSIS — I10 Essential (primary) hypertension: Secondary | ICD-10-CM

## 2015-02-10 DIAGNOSIS — R42 Dizziness and giddiness: Secondary | ICD-10-CM

## 2015-02-10 MED ORDER — METOPROLOL TARTRATE 50 MG PO TABS: 50.0000 mg | ORAL_TABLET | Freq: Two times a day (BID) | ORAL | Status: DC

## 2015-02-10 MED ORDER — LISINOPRIL 40 MG PO TABS: 40.0000 mg | ORAL_TABLET | Freq: Every day | ORAL | Status: DC

## 2015-02-10 MED ORDER — AMLODIPINE BESYLATE 10 MG PO TABS: 10.0000 mg | ORAL_TABLET | Freq: Every day | ORAL | Status: DC

## 2015-02-10 NOTE — Assessment & Plan Note (Signed)
-  Pt declined screening colonoscopy and annual influenza vaccination -Inquire about HIV and HCV Ab (born b/w 1945-65) testing at next visit

## 2015-02-10 NOTE — Progress Notes (Signed)
Patient ID: Shawn Lawrence, male   DOB: [birth year], 54 y.o.   MRN: [id number]    Subjective:   Patient ID: Shawn Lawrence male   DOB: 1961-03-20 54 y.o.   MRN: [id number]  HPI: Shawn Lawrence is a 54 y.o. pleasant man with past medical history of hypertension, hyperlipidemia, OSA on CPAP, and obesity who presents for medication refill.   He reports compliance with taking lisinopril, lopressor, and amlodipine for hypertension and reports running out today. He has had history of gout with HCTZ. He has chronic blurry vision and occasional headache but denies chest pain, headache, or LE edema.   He reports having sensation of room spinning while lying down and  turning his head for the past 4-5 days with no prior history. He denies lightheadedness with standing, nausea, tinnitus, hearing loss, ear pain/fullness, or recent URI.   He declines flu shot and colonoscopy.      Past Medical History  Diagnosis Date  . Hypertension   . Obesity   . Abscess 10/2004 s/p I and D    peritonsilar  . 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 Gout     no aspiration   Current Outpatient Prescriptions  Medication Sig Dispense Refill  . amLODipine (NORVASC) 10 MG tablet Take 1 tablet (10 mg total) by mouth daily. (Patient not taking: Reported on 11/28/2014) 90 tablet 0  . lisinopril (PRINIVIL,ZESTRIL) 40 MG tablet Take 1 tablet (40 mg total) by mouth daily. 90 tablet 0  . metoprolol (LOPRESSOR) 50 MG tablet Take 1 tablet (50 mg total) by mouth 2 (two) times daily. 180 tablet 0  . pravastatin (PRAVACHOL) 40 MG tablet Take 1 tablet (40 mg total) by mouth every evening. 30 tablet 2   No current facility-administered medications for this visit.   Family History  Problem Relation Age of Onset  . Diabetes Mother    Social History   Social History  . Marital Status: Married    Spouse Name: N/A  . Number of Children: N/A  . Years of Education: N/A   Social History Main Topics  .  Smoking status: Never Smoker   . Smokeless tobacco: None  . Alcohol Use: No  . Drug Use: No  . Sexual Activity: Not Asked   Other Topics Concern  . None   Social History Narrative   Occuaption: accounting, full-time   Married   Most of family has HTN   Review of Systems: Review of Systems  Constitutional: Negative for fever and chills.  HENT: Negative for ear discharge, ear pain, hearing loss and tinnitus.   Eyes: Positive for blurred vision (chronic).  Respiratory: Negative for cough, shortness of breath and wheezing.   Cardiovascular: Negative for chest pain and leg swelling.  Gastrointestinal: Negative for nausea, vomiting, abdominal pain, diarrhea, constipation and blood in stool.  Genitourinary: Negative for dysuria, urgency, frequency and hematuria.  Musculoskeletal: Positive for neck pain (chronic with stiffness).  Neurological: Positive for dizziness (vertigo for past 4-5 days ), sensory change (occasionally in b/l hands at night) and headaches (occasioanally ).    Objective:  Physical Exam: Filed Vitals:   02/10/15 1559  BP: 154/91  Pulse: 70  Temp: 98.1 F (36.7 C)  TempSrc: Oral  Height: 6' (1.829 m)  Weight: 285 lb 1.6 oz (129.321 kg)  SpO2: 98%    Physical Exam  Constitutional: He is oriented to person, place, and time. He appears well-developed and well-nourished. No distress.  HENT:  Head: Normocephalic and atraumatic.  Right Ear: External ear normal.  Left Ear: External ear normal.  Nose: Nose normal.  Mouth/Throat: Oropharynx is clear and moist. No oropharyngeal exudate.  Eyes: Conjunctivae and EOM are normal. Pupils are equal, round, and reactive to light. Right eye exhibits no discharge. Left eye exhibits no discharge. No scleral icterus.  Neck: Normal range of motion. Neck supple.  Cardiovascular: Normal rate, regular rhythm and normal heart sounds.   Pulmonary/Chest: Effort normal and breath sounds normal. No respiratory distress. He has no  wheezes. He has no rales.  Abdominal: Soft. Bowel sounds are normal. He exhibits no distension. There is no tenderness. There is no rebound and no guarding.  Musculoskeletal: Normal range of motion. He exhibits no edema or tenderness.  Neurological: He is alert and oriented to person, place, and time.  No vertigo with head turning   Skin: Skin is warm and dry. No rash noted. He is not diaphoretic. No erythema. No pallor.  Psychiatric: He has a normal mood and affect. His behavior is normal. Judgment and thought content normal.    Assessment & Plan:   Please see problem list for problem-based assessment and plan

## 2015-02-10 NOTE — Assessment & Plan Note (Signed)
Assessment: Pt with moderately well-controlled hypertension with reported compliance with three-class (BB, ACEi, and CCB) anti-hypertensive therapy who presents with blood pressure of 154/91.    Plan:  -BP 154/91 not at goal <140/90, suspect possible noncompliance   -Refill lopressor 50 mg BID, lisinopril 40 mg daily, and amlodipine 10 mg daily  -Consider adding spironolactone 25 mg daily at next visit if due to drug-resistant hypertension  -Last CMP on 11/27/14 was normal

## 2015-02-10 NOTE — Assessment & Plan Note (Signed)
Assessment: Pt with 4-5 day history of vertigo possibly due to BPPV who presents with no alarm symptoms.    Plan:  -Pt declined performing Dix-Hallpike maneuver  -Pt instructed to call if symptoms persist for trial of meclizine therapy  -Pt given epley maneuver exercises to perform at home if vertigo does not improve

## 2015-02-10 NOTE — Patient Instructions (Signed)
-[facility]  -If your dizziness gets worse please let me know so I can give you a medication called meclizine  -Please do the exercises below if it does not get better  -Pleasure meeting you!    Benign Positional Vertigo Vertigo means you feel like you or your surroundings are moving when they are not. Benign positional vertigo is the most common form of vertigo. Benign means that the cause of your condition is not serious. Benign positional vertigo is more common in older adults. CAUSES  Benign positional vertigo is the result of an upset in the labyrinth system. This is an area in the middle ear that helps control your balance. This may be caused by a viral infection, head injury, or repetitive motion. However, often no specific cause is found. SYMPTOMS  Symptoms of benign positional vertigo occur when you move your head or eyes in different directions. Some of the symptoms may include: 1. Loss of balance and falls. 2. Vomiting. 3. Blurred vision. 4. Dizziness. 5. Nausea. 6. Involuntary eye movements (nystagmus). DIAGNOSIS  Benign positional vertigo is usually diagnosed by physical exam. If the specific cause of your benign positional vertigo is unknown, your caregiver may perform imaging tests, such as magnetic resonance imaging (MRI) or computed tomography (CT). TREATMENT  Your caregiver may recommend movements or procedures to correct the benign positional vertigo. Medicines such as meclizine, benzodiazepines, and medicines for nausea may be used to treat your symptoms. In rare cases, if your symptoms are caused by certain conditions that affect the inner ear, you may need surgery. HOME CARE INSTRUCTIONS   Follow your caregiver's instructions.  Move slowly. Do not make sudden body or head movements.  Avoid driving.  Avoid operating heavy machinery.  Avoid performing any tasks that would be dangerous to you or others during  a vertigo episode.  Drink enough fluids to keep your urine clear or pale yellow. SEEK IMMEDIATE MEDICAL CARE IF:   You develop problems with walking, weakness, numbness, or using your arms, hands, or legs.  You have difficulty speaking.  You develop severe headaches.  Your nausea or vomiting continues or gets worse.  You develop visual changes.  Your family or friends notice any behavioral changes.  Your condition gets worse.  You have a fever.  You develop a stiff neck or sensitivity to light. MAKE SURE YOU:   Understand these instructions.  Will watch your condition.  Will get help right away if you are not doing well or get worse. Document Released: 03/07/2006 Document Revised: 08/22/2011 Document Reviewed: 02/17/2011 [facility] Patient Information 2015 [place], [place]. This information is not intended to replace advice given to you by your health care provider. Make sure you discuss any questions you have with your health care provider.    Epley Maneuver Self-Care WHAT IS THE EPLEY MANEUVER? The Epley maneuver is an exercise you can do to relieve symptoms of benign paroxysmal positional vertigo (BPPV). This condition is often just referred to as vertigo. BPPV is caused by the movement of tiny crystals (canaliths) inside your inner ear. The accumulation and movement of canaliths in your inner ear causes a sudden spinning sensation (vertigo) when you move your head to certain positions. Vertigo usually lasts about 30 seconds. BPPV usually occurs in just one ear. If you get vertigo when you lie on your left side, you probably have BPPV in your left ear. Your health care provider can tell you which ear is involved.  BPPV may be caused by a head injury. Many people older than 50 get BPPV for unknown reasons. If you have been diagnosed with BPPV, your health care provider may teach you how to do this maneuver. BPPV is not life threatening (benign) and usually goes away in time.    WHEN SHOULD I PERFORM THE EPLEY MANEUVER? You can do this maneuver at home whenever you have symptoms of vertigo. You may do the Epley maneuver up to 3 times a day until your symptoms of vertigo go away. HOW SHOULD I DO THE EPLEY MANEUVER? 7. Sit on the edge of a bed or table with your back straight. Your legs should be extended or hanging over the edge of the bed or table.  8. Turn your head halfway toward the affected ear.  9. Lie backward quickly with your head turned until you are lying flat on your back. You may want to position a pillow under your shoulders.  10. Hold this position for 30 seconds. You may experience an attack of vertigo. This is normal. Hold this position until the vertigo stops. 11. Then turn your head to the opposite direction until your unaffected ear is facing the floor.  12. Hold this position for 30 seconds. You may experience an attack of vertigo. This is normal. Hold this position until the vertigo stops. 13. Now turn your whole body to the same side as your head. Hold for another 30 seconds.  14. You can then sit back up. ARE THERE RISKS TO THIS MANEUVER? In some cases, you may have other symptoms (such as changes in your vision, weakness, or numbness). If you have these symptoms, stop doing the maneuver and call your health care provider. Even if doing these maneuvers relieves your vertigo, you may still have dizziness. Dizziness is the sensation of light-headedness but without the sensation of movement. Even though the Epley maneuver may relieve your vertigo, it is possible that your symptoms will return within 5 years. WHAT SHOULD I DO AFTER THIS MANEUVER? After doing the Epley maneuver, you can return to your normal activities. Ask your doctor if there is anything you should do at home to prevent vertigo. This may include:  Sleeping with two or more pillows to keep your head elevated.  Not sleeping on the side of your affected ear.  Getting up slowly  from bed.  Avoiding sudden movements during the day.  Avoiding extreme head movement, like looking up or bending over.  Wearing a cervical collar to prevent sudden head movements. WHAT SHOULD I DO IF MY SYMPTOMS GET WORSE? Call your health care provider if your vertigo gets worse. Call your provider right way if you have other symptoms, including:   Nausea.  Vomiting.  Headache.  Weakness.  Numbness.  Vision changes. Document Released: 06/04/2013 Document Reviewed: 06/04/2013 [facility] Patient Information 2015 [place], [place]. This information is not intended to replace advice given to you by your health care provider. Make sure you discuss any questions you have with your health care provider.   General Instructions:   Thank you for bringing your medicines today. This helps [place] keep you safe from mistakes.   Progress Toward Treatment Goals:  Treatment Goal 11/27/2014  Blood pressure deteriorated    Self Care Goals & Plans:  Self Care Goal 02/10/2015  Manage my medications take my medicines as prescribed; bring my medications to every visit; refill my medications on time; follow the sick day instructions if I am sick  Monitor my health  keep track of my blood pressure; keep track of my weight  Eat healthy foods eat more vegetables; eat fruit for snacks and desserts; eat baked foods instead of fried foods  Be physically active find an activity I enjoy  Meeting treatment goals -    No flowsheet data found.   Care Management & Community Referrals:  Referral 11/27/2014  Referrals made for care management support none needed  Referrals made to community resources none

## 2015-02-17 NOTE — Progress Notes (Signed)
Internal Medicine Clinic Attending  Case discussed with Dr. Rabbani soon after the resident saw the patient.  We reviewed the resident's history and exam and pertinent patient test results.  I agree with the assessment, diagnosis, and plan of care documented in the resident's note.  

## 2015-04-08 ENCOUNTER — Ambulatory Visit: Payer: Self-pay

## 2015-06-18 ENCOUNTER — Other Ambulatory Visit: Payer: Self-pay | Admitting: Internal Medicine

## 2015-06-18 DIAGNOSIS — I10 Essential (primary) hypertension: Secondary | ICD-10-CM

## 2015-06-18 NOTE — Telephone Encounter (Signed)
Pt requesting lisinopril to be filled [facility].  

## 2015-06-19 MED ORDER — LISINOPRIL 40 MG PO TABS: 40.0000 mg | ORAL_TABLET | Freq: Every day | ORAL | Status: DC

## 2015-06-19 NOTE — Telephone Encounter (Signed)
Patient calling again to see if his refill is ready.  Patient states his Pharmacy is closed on tomorrow.

## 2015-06-22 ENCOUNTER — Telehealth: Payer: Self-pay | Admitting: Internal Medicine

## 2015-06-22 NOTE — Telephone Encounter (Signed)
Pt states [facility]ill at the [facility] for lisinopril. Please call pt back.

## 2015-06-22 NOTE — Telephone Encounter (Signed)
Spoke w/ [facility], they have script, it is ready for pt pic up

## 2015-10-06 ENCOUNTER — Ambulatory Visit: Payer: Self-pay

## 2015-10-06 ENCOUNTER — Encounter: Payer: Self-pay | Admitting: Internal Medicine

## 2015-10-06 ENCOUNTER — Ambulatory Visit (INDEPENDENT_AMBULATORY_CARE_PROVIDER_SITE_OTHER): Payer: Self-pay | Admitting: Internal Medicine

## 2015-10-06 VITALS — BP 172/100 | HR 70 | Temp 98.0°F | Ht 71.0 in | Wt 293.2 lb

## 2015-10-06 DIAGNOSIS — E669 Obesity, unspecified: Secondary | ICD-10-CM

## 2015-10-06 DIAGNOSIS — I10 Essential (primary) hypertension: Secondary | ICD-10-CM

## 2015-10-06 DIAGNOSIS — R7303 Prediabetes: Secondary | ICD-10-CM | POA: Insufficient documentation

## 2015-10-06 DIAGNOSIS — Z6841 Body Mass Index (BMI) 40.0 and over, adult: Secondary | ICD-10-CM

## 2015-10-06 LAB — POCT GLYCOSYLATED HEMOGLOBIN (HGB A1C): HEMOGLOBIN A1C: 5.7

## 2015-10-06 LAB — GLUCOSE, CAPILLARY: GLUCOSE-CAPILLARY: 107 mg/dL — AB (ref 65–99)

## 2015-10-06 MED ORDER — METOPROLOL TARTRATE 50 MG PO TABS: 50.0000 mg | ORAL_TABLET | Freq: Two times a day (BID) | ORAL | Status: DC

## 2015-10-06 NOTE — Progress Notes (Signed)
3Patient ID: Shawn Lawrence, male   DOB: [birth year], 55 y.o.   MRN: [id number]     Subjective:   Patient ID: Shawn Lawrence male    DOB: [birth year] 55 y.o.    MRN: [id number] Health Maintenance Due: Health Maintenance Due  Topic Date Due  . Hepatitis C Screening  009/11/62  . HIV Screening  06/26/1975    _________________________________________________  HPI: Shawn Lawrence is a 55 y.o. male here for follow up visit for HTN.  Pt has a PMH outlined below.  Please see problem-based charting assessment and plan for further status of patient's chronic medical problems addressed at today's visit.  PMH: Past Medical History  Diagnosis Date  . Hypertension   . Obesity   . Abscess 10/2004 s/p I and D    peritonsilar  . 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 Gout     no aspiration    Medications: Current Outpatient Prescriptions on File Prior to Visit  Medication Sig Dispense Refill  . amLODipine (NORVASC) 10 MG tablet Take 1 tablet (10 mg total) by mouth daily. 90 tablet 3  . lisinopril (PRINIVIL,ZESTRIL) 40 MG tablet Take 1 tablet (40 mg total) by mouth daily. 90 tablet 3  . metoprolol (LOPRESSOR) 50 MG tablet Take 1 tablet (50 mg total) by mouth 2 (two) times daily. 180 tablet 3   No current facility-administered medications on file prior to visit.    Allergies: Allergies  Allergen Reactions  . Hctz [Hydrochlorothiazide] Other (See Comments)    Exacerbation of gout    FH: Family History  Problem Relation Age of Onset  . Diabetes Mother     SH: Social History   Social History  . Marital Status: Married    Spouse Name: N/A  . Number of Children: N/A  . Years of Education: N/A   Social History Main Topics  . Smoking status: Never Smoker   . Smokeless tobacco: None  . Alcohol Use: No  . Drug Use: No  . Sexual Activity: Not Asked   Other Topics Concern  . None   Social History Narrative   Occuaption: accounting, full-time   Married   Most of family has HTN    Review of Systems: Constitutional: Negative for fever, chills.  Eyes: +blurred vision.  Respiratory: Negative for cough and shortness of breath.  Cardiovascular: Negative for chest pain.  Gastrointestinal: Negative for nausea, vomiting. Neurological: +dizziness, +headache.   Objective:   Vital Signs: Filed Vitals:   10/06/15 1052  BP: 171/108  Pulse: 62  Temp: 98 F (36.7 C)  TempSrc: Oral  Height: 5\' 11"  (1.803 m)  Weight: 293 lb 3.2 oz (132.995 kg)  SpO2: 100%      BP Readings from Last 3 Encounters:  10/06/15 171/108  02/10/15 154/91  11/27/14 163/109    Physical Exam: Constitutional: Vital signs reviewed.  Patient is in NAD and cooperative with exam.  Head: Normocephalic and atraumatic. Eyes: EOMI, conjunctivae nl, no scleral icterus.  Neck: Supple. Cardiovascular: RRR, no MRG. Pulmonary/Chest: Normal effort, CTAB, no wheezes, rales, or rhonchi. Abdominal: Soft. NT/ND +BS. Neurological: A&O x3, cranial nerves II-XII are grossly intact, moving all extremities. Extremities: No LE edema. Skin: Warm, dry and intact.    Assessment & Plan:   Assessment and plan was discussed and formulated with my attending.

## 2015-10-06 NOTE — Addendum Note (Signed)
Addended by: Bufford SpikesFULCHER, Janay Canan N on: 10/06/2015 11:33 AM   Modules accepted: Kipp BroodSmartSet

## 2015-10-06 NOTE — Addendum Note (Signed)
Addended by: Gustavus MessingANIELS, Jaidalyn Schillo E on: 10/06/2015 11:31 AM   Modules accepted: Kipp BroodSmartSet

## 2015-10-06 NOTE — Assessment & Plan Note (Signed)
Pt is obese with a BMI>40.   -discussed weight loss and exercise

## 2015-10-06 NOTE — Assessment & Plan Note (Addendum)
Pt BP today 171/108.  Reports compliance with amlodipine and lisinopril.  However, he has not been taking metoprolol bid.  He denies use of ETOH, tobacco, illicit drugs, OTC meds like decongestants.  Does use advil PRN.  Denies using a lot of salt.  States a strong FH of HTN.  BMI is also ~40 so will need to address this.  -cont amlodipine, lisinopril -refilled metoprolol bid ([facility]) -DASH diet -encouraged diet and exercise -return for f/u in 2 weeks

## 2015-10-06 NOTE — Patient Instructions (Signed)
Thank you for your visit today.   Please return to the internal medicine clinic in about 2 weeks or sooner if needed.     I have made the following additions/changes to your medications:  Please continue your blood pressure medications.  I have refilled metoprolol to be taken twice daily.   Please limit your sodium to less than  per day.  Weight loss is also advisable.   Please be sure to bring all of your medications with you to every visit; this includes herbal supplements, vitamins, eye drops, and any over-the-counter medications.   Should you have any questions regarding your medications and/or any new or worsening symptoms, please be sure to call the clinic at [phone].   If you believe that you are suffering from a life threatening condition or one that may result in the loss of limb or function, then you should call 911 and proceed to the nearest Emergency Department.   DASH Eating Plan DASH stands for "Dietary Approaches to Stop Hypertension." The DASH eating plan is a healthy eating plan that has been shown to reduce high blood pressure (hypertension). Additional health benefits may include reducing the risk of type 2 diabetes mellitus, heart disease, and stroke. The DASH eating plan may also help with weight loss. WHAT DO I NEED TO KNOW ABOUT THE DASH EATING PLAN? For the DASH eating plan, you will follow these general guidelines:  Choose foods with a percent daily value for sodium of less than 5% (as listed on the food label).  Use salt-free seasonings or herbs instead of table salt or sea salt.  Check with your health care provider or pharmacist before using salt substitutes.  Eat lower-sodium products, often labeled as "lower sodium" or "no salt added."  Eat fresh foods.  Eat more vegetables, fruits, and low-fat dairy products.  Choose whole grains. Look for the word "whole" as the first word in the ingredient list.  Choose fish and skinless chicken or  [place] more often than red meat. Limit fish, poultry, and meat to 6 oz (170 g) each day.  Limit sweets, desserts, sugars, and sugary drinks.  Choose heart-healthy fats.  Limit cheese to 1 oz (28 g) per day.  Eat more home-cooked food and less restaurant, buffet, and fast food.  Limit fried foods.  Cook foods using methods other than frying.  Limit canned vegetables. If you do use them, rinse them well to decrease the sodium.  When eating at a restaurant, ask that your food be prepared with less salt, or no salt if possible. WHAT FOODS CAN I EAT? Seek help from a dietitian for individual calorie needs. Grains Whole grain or whole wheat bread. Brown rice. Whole grain or whole wheat pasta. Quinoa, bulgur, and whole grain cereals. Low-sodium cereals. Corn or whole wheat flour tortillas. Whole grain cornbread. Whole grain crackers. Low-sodium crackers. Vegetables Fresh or frozen vegetables (raw, steamed, roasted, or grilled). Low-sodium or reduced-sodium tomato and vegetable juices. Low-sodium or reduced-sodium tomato sauce and paste. Low-sodium or reduced-sodium canned vegetables.  Fruits All fresh, canned (in natural juice), or frozen fruits. Meat and Other Protein Products Ground beef (85% or leaner), grass-fed beef, or beef trimmed of fat. Skinless chicken or [place]. Ground chicken or [place]. Pork trimmed of fat. All fish and seafood. Eggs. Dried beans, peas, or lentils. Unsalted nuts and seeds. Unsalted canned beans. Dairy Low-fat dairy products, such as skim or 1% milk, 2% or reduced-fat cheeses, low-fat ricotta or cottage cheese, or plain low-fat yogurt. Low-sodium  or reduced-sodium cheeses. Fats and Oils Tub margarines without trans fats. Light or reduced-fat mayonnaise and salad dressings (reduced sodium). Avocado. Safflower, olive, or canola oils. Natural peanut or almond butter. Other Unsalted popcorn and pretzels. The items listed above may not be a complete list of  recommended foods or beverages. Contact your dietitian for more options. WHAT FOODS ARE NOT RECOMMENDED? Grains White bread. White pasta. White rice. Refined cornbread. Bagels and croissants. Crackers that contain trans fat. Vegetables Creamed or fried vegetables. Vegetables in a cheese sauce. Regular canned vegetables. Regular canned tomato sauce and paste. Regular tomato and vegetable juices. Fruits Dried fruits. Canned fruit in light or heavy syrup. Fruit juice. Meat and Other Protein Products Fatty cuts of meat. Ribs, chicken wings, bacon, sausage, bologna, salami, chitterlings, fatback, hot dogs, bratwurst, and packaged luncheon meats. Salted nuts and seeds. Canned beans with salt. Dairy Whole or 2% milk, cream, half-and-half, and cream cheese. Whole-fat or sweetened yogurt. Full-fat cheeses or blue cheese. Nondairy creamers and whipped toppings. Processed cheese, cheese spreads, or cheese curds. Condiments Onion and garlic salt, seasoned salt, table salt, and sea salt. Canned and packaged gravies. Worcestershire sauce. Tartar sauce. Barbecue sauce. Teriyaki sauce. Soy sauce, including reduced sodium. Steak sauce. Fish sauce. Oyster sauce. Cocktail sauce. Horseradish. Ketchup and mustard. Meat flavorings and tenderizers. Bouillon cubes. Hot sauce. Tabasco sauce. Marinades. Taco seasonings. Relishes. Fats and Oils Butter, stick margarine, lard, shortening, ghee, and bacon fat. Coconut, palm kernel, or palm oils. Regular salad dressings. Other Pickles and olives. Salted popcorn and pretzels. The items listed above may not be a complete list of foods and beverages to avoid. Contact your dietitian for more information. WHERE CAN I FIND MORE INFORMATION? National Heart, Lung, and Blood Institute: CablePromo.itwww.nhlbi.nih.gov/health/health-topics/topics/dash/   This information is not intended to replace advice given to you by your health care provider. Make sure you discuss any questions you have with  your health care provider.   Document Released: 05/19/2011 Document Revised: 06/20/2014 Document Reviewed: 04/03/2013 Elsevier Interactive Patient Education [employer]2016 Elsevier Inc.

## 2015-10-07 ENCOUNTER — Encounter: Payer: Self-pay | Admitting: Internal Medicine

## 2015-10-07 ENCOUNTER — Emergency Department (HOSPITAL_COMMUNITY)
Admission: EM | Admit: 2015-10-07 | Discharge: 2015-10-07 | Disposition: A | Discharge status: Home / Self Care | Payer: Self-pay | Attending: Emergency Medicine | Admitting: Emergency Medicine

## 2015-10-07 ENCOUNTER — Other Ambulatory Visit: Payer: Self-pay

## 2015-10-07 ENCOUNTER — Emergency Department (HOSPITAL_COMMUNITY): Payer: Self-pay

## 2015-10-07 ENCOUNTER — Encounter (HOSPITAL_COMMUNITY): Payer: Self-pay | Admitting: *Deleted

## 2015-10-07 DIAGNOSIS — Z8739 Personal history of other diseases of the musculoskeletal system and connective tissue: Secondary | ICD-10-CM | POA: Insufficient documentation

## 2015-10-07 DIAGNOSIS — Z79899 Other long term (current) drug therapy: Secondary | ICD-10-CM | POA: Insufficient documentation

## 2015-10-07 DIAGNOSIS — Z872 Personal history of diseases of the skin and subcutaneous tissue: Secondary | ICD-10-CM | POA: Insufficient documentation

## 2015-10-07 DIAGNOSIS — Z8619 Personal history of other infectious and parasitic diseases: Secondary | ICD-10-CM | POA: Insufficient documentation

## 2015-10-07 DIAGNOSIS — E669 Obesity, unspecified: Secondary | ICD-10-CM | POA: Insufficient documentation

## 2015-10-07 DIAGNOSIS — I10 Essential (primary) hypertension: Secondary | ICD-10-CM | POA: Insufficient documentation

## 2015-10-07 LAB — LIPID PANEL
CHOLESTEROL TOTAL: 129 mg/dL (ref 100–199)
Chol/HDL Ratio: 4 ratio units (ref 0.0–5.0)
HDL: 32 mg/dL — AB (ref >39)
LDL Calculated: 75 mg/dL (ref 0–99)
TRIGLYCERIDES: 112 mg/dL (ref 0–149)
VLDL Cholesterol Cal: 22 mg/dL (ref 5–40)

## 2015-10-07 LAB — CBC
HEMATOCRIT: 47.6 % (ref 37.5–51.0)
HEMATOCRIT: 47.3 % (ref 39.0–52.0)
HEMOGLOBIN: 16.1 g/dL (ref 12.6–17.7)
HEMOGLOBIN: 15.4 g/dL (ref 13.0–17.0)
MCH: 28.2 pg (ref 26.6–33.0)
MCH: 27.5 pg (ref 26.0–34.0)
MCHC: 33.8 g/dL (ref 31.5–35.7)
MCHC: 32.6 g/dL (ref 30.0–36.0)
MCV: 83 fL (ref 79–97)
MCV: 84.5 fL (ref 78.0–100.0)
Platelets: 251 10*3/uL (ref 150–379)
Platelets: 252 10*3/uL (ref 150–400)
RBC: 5.71 x10E6/uL (ref 4.14–5.80)
RBC: 5.6 MIL/uL (ref 4.22–5.81)
RDW: 14.4 % (ref 12.3–15.4)
RDW: 13.6 % (ref 11.5–15.5)
WBC: 8.7 10*3/uL (ref 3.4–10.8)
WBC: 10.4 10*3/uL (ref 4.0–10.5)

## 2015-10-07 LAB — URINALYSIS, ROUTINE W REFLEX MICROSCOPIC
Bilirubin Urine: NEGATIVE
Glucose, UA: NEGATIVE mg/dL
KETONES UR: NEGATIVE mg/dL
LEUKOCYTES UA: NEGATIVE
NITRITE: NEGATIVE
PH: 5.5 (ref 5.0–8.0)
PROTEIN: NEGATIVE mg/dL
Specific Gravity, Urine: 1.02 (ref 1.005–1.030)

## 2015-10-07 LAB — CMP14 + ANION GAP
ALT: 24 IU/L (ref 0–44)
ANION GAP: 17 mmol/L (ref 10.0–18.0)
AST: 22 IU/L (ref 0–40)
Albumin/Globulin Ratio: 1.1 — ABNORMAL LOW (ref 1.2–2.2)
Albumin: 4.1 g/dL (ref 3.5–5.5)
Alkaline Phosphatase: 82 IU/L (ref 39–117)
BUN/Creatinine Ratio: 10 (ref 9–20)
BUN: 8 mg/dL (ref 6–24)
Bilirubin Total: <0.2 mg/dL (ref 0.0–1.2)
CALCIUM: 9.2 mg/dL (ref 8.7–10.2)
CO2: 23 mmol/L (ref 18–29)
CREATININE: 0.78 mg/dL (ref 0.76–1.27)
Chloride: 102 mmol/L (ref 96–106)
GFR calc Af Amer: 117 mL/min/{1.73_m2} (ref >59)
GFR calc non Af Amer: 102 mL/min/{1.73_m2} (ref >59)
Globulin, Total: 3.6 g/dL (ref 1.5–4.5)
Glucose: 92 mg/dL (ref 65–99)
Potassium: 4.8 mmol/L (ref 3.5–5.2)
Sodium: 142 mmol/L (ref 134–144)
TOTAL PROTEIN: 7.7 g/dL (ref 6.0–8.5)

## 2015-10-07 LAB — BASIC METABOLIC PANEL
ANION GAP: 9 (ref 5–15)
BUN: 11 mg/dL (ref 6–20)
CO2: 24 mmol/L (ref 22–32)
Calcium: 9.3 mg/dL (ref 8.9–10.3)
Chloride: 107 mmol/L (ref 101–111)
Creatinine, Ser: 0.76 mg/dL (ref 0.61–1.24)
Glucose, Bld: 99 mg/dL (ref 65–99)
POTASSIUM: 3.8 mmol/L (ref 3.5–5.1)
SODIUM: 140 mmol/L (ref 135–145)

## 2015-10-07 LAB — URINE MICROSCOPIC-ADD ON

## 2015-10-07 LAB — I-STAT TROPONIN, ED: TROPONIN I, POC: 0.01 ng/mL (ref 0.00–0.08)

## 2015-10-07 LAB — CBG MONITORING, ED: Glucose-Capillary: 84 mg/dL (ref 65–99)

## 2015-10-07 NOTE — Progress Notes (Signed)
Case discussed with Dr. Gill soon after the resident saw the patient.  We reviewed the resident's history and exam and pertinent patient test results.  I agree with the assessment, diagnosis, and plan of care documented in the resident's note. 

## 2015-10-07 NOTE — Discharge Instructions (Signed)
Increase your lisinopril to 40 mg twice a day   How to Take Your Blood Pressure HOW DO I GET A BLOOD PRESSURE MACHINE?  You can buy an electronic home blood pressure machine at your local pharmacy. Insurance will sometimes cover the cost if you have a prescription.  Ask your doctor what type of machine is best for you. There are different machines for your arm and your wrist.  If you decide to buy a machine to check your blood pressure on your arm, first check the size of your arm so you can buy the right size cuff. To check the size of your arm:   Use a measuring tape that shows both inches and centimeters.   Wrap the measuring tape around the upper-middle part of your arm. You may need someone to help you measure.   Write down your arm measurement in both inches and centimeters.   To measure your blood pressure correctly, it is important to have the right size cuff.   If your arm is up to 13 inches (up to 34 centimeters), get an adult cuff size.  If your arm is 13 to 17 inches (35 to 44 centimeters), get a large adult cuff size.    If your arm is 17 to 20 inches (45 to 52 centimeters), get an adult thigh cuff.  WHAT DO THE NUMBERS MEAN?   There are two numbers that make up your blood pressure. For example: 120/80.  The first number (120 in our example) is called the "systolic pressure." It is a measure of the pressure in your blood vessels when your heart is pumping blood.  The second number (80 in our example) is called the "diastolic pressure." It is a measure of the pressure in your blood vessels when your heart is resting between beats.  Your doctor will tell you what your blood pressure should be. WHAT SHOULD I DO BEFORE I CHECK MY BLOOD PRESSURE?   Try to rest or relax for at least 30 minutes before you check your blood pressure.  Do not smoke.  Do not have any drinks with caffeine, such as:  Soda.  Coffee.  Tea.  Check your blood pressure in a quiet  room.  Sit down and stretch out your arm on a table. Keep your arm at about the level of your heart. Let your arm relax.  Make sure that your legs are not crossed. HOW DO I CHECK MY BLOOD PRESSURE?  Follow the directions that came with your machine.  Make sure you remove any tight-fitting clothing from your arm or wrist. Wrap the cuff around your upper arm or wrist. You should be able to fit a finger between the cuff and your arm. If you cannot fit a finger between the cuff and your arm, it is too tight and should be removed and rewrapped.  Some units require you to manually pump up the arm cuff.  Automatic units inflate the cuff when you press a button.  Cuff deflation is automatic in both models.  After the cuff is inflated, the unit measures your blood pressure and pulse. The readings are shown on a monitor. Hold still and breathe normally while the cuff is inflated.  Getting a reading takes less than a minute.  Some models store readings in a memory. Some provide a printout of readings. If your machine does not store your readings, keep a written record.  Take readings with you to your next visit with your doctor.  This information is not intended to replace advice given to you by your health care provider. Make sure you discuss any questions you have with your health care provider.   Document Released: 05/12/2008 Document Revised: 06/20/2014 Document Reviewed: 07/25/2013 Elsevier Interactive Patient Education [employer]

## 2015-10-07 NOTE — Addendum Note (Signed)
Addended by: Doneen PoissonKLIMA, Ramata Strothman D on: 10/07/2015 02:44 PM   Modules accepted: Level of Service

## 2015-10-07 NOTE — ED Notes (Addendum)
PT is here with headache, dizziness, and weakness for 2 weeks, intermittently, and was seen [facility] and they started on a bp medication. Pt states not dizzy when he went to bed but woke up with the dizziness.  When walked to work got sweaty and tired  And had some chest pain

## 2015-10-07 NOTE — ED Provider Notes (Addendum)
CSN: [id number]     Arrival date & time 10/07/15  1624 History   First MD Initiated Contact with Patient 10/07/15 2010     Chief Complaint  Patient presents with  . Hypertension  . Dizziness      HPI PT is here with headache, dizziness, and weakness for 2 weeks, intermittently, and was [facility] and they started on a bp medication. Pt states not dizzy when he went to bed but woke up with the dizziness. When walked to work got sweaty and tired And had some chest pain Past Medical History  Diagnosis Date  . Hypertension   . Obesity   . Abscess 10/2004 s/p I and D    peritonsilar  . 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 Gout     no aspiration   History reviewed. No pertinent past surgical history. Family History  Problem Relation Age of Onset  . Diabetes Mother    Social History  Substance Use Topics  . Smoking status: Never Smoker   . Smokeless tobacco: None  . Alcohol Use: No    Review of Systems  All other systems reviewed and are negative.     Allergies  Hctz  Home Medications   Prior to Admission medications   Medication Sig Start Date End Date Taking? Authorizing Provider  amLODipine (NORVASC) 10 MG tablet Take 1 tablet (10 mg total) by mouth daily. 02/10/15 02/10/16 Yes Marjan Rabbani, MD  lisinopril (PRINIVIL,ZESTRIL) 40 MG tablet Take 1 tablet (40 mg total) by mouth daily. 06/19/15 06/17/16 Yes Tasrif Ahmed, MD  metoprolol (LOPRESSOR) 50 MG tablet Take 1 tablet (50 mg total) by mouth 2 (two) times daily. 10/06/15 10/05/16 Yes Marrian SalvageJacquelyn S Gill, MD   BP 130/76 mmHg  Pulse 53  Temp(Src) 98.3 F (36.8 C) (Oral)  Resp 18  SpO2 97% Physical Exam  Constitutional: He is oriented to person, place, and time. He appears well-developed and well-nourished. No distress.  HENT:  Head: Normocephalic and atraumatic.  Eyes: Pupils are equal, round, and reactive to light.  Neck: Normal range of motion.  Cardiovascular: Normal rate and intact distal pulses.    Pulmonary/Chest: No respiratory distress.  Abdominal: Normal appearance. He exhibits no distension.  Musculoskeletal: Normal range of motion.  Neurological: He is alert and oriented to person, place, and time. No cranial nerve deficit.  Skin: Skin is warm and dry. No rash noted.  Psychiatric: He has a normal mood and affect. His behavior is normal.  Nursing note and vitals reviewed.   ED Course  Procedures (including critical care time) Labs Review Labs Reviewed  URINALYSIS, ROUTINE W REFLEX [facility]CROSCOPIC ([facility]) - Abnormal; Notable for the following:    Hgb urine dipstick MODERATE (*)    All other components within normal limits  URINE MICROSCOPIC-ADD ON - Abnormal; Notable for the following:    Squamous Epithelial / LPF 0-5 (*)    Bacteria, UA RARE (*)    All other components within normal limits  BASIC METABOLIC PANEL  CBC  CBG MONITORING, ED  Rosezena SensorI-STAT TROPOININ, ED    Imaging Review Dg Chest 2 View  10/07/2015  CLINICAL DATA:  Dizziness, hypertension. EXAM: CHEST  2 VIEW COMPARISON:  None. FINDINGS: The heart size and mediastinal contours are within normal limits. Both lungs are clear. No pneumothorax or pleural effusion is noted. The visualized skeletal structures are unremarkable. IMPRESSION: No active cardiopulmonary disease. Electronically Signed   By: Lupita RaiderJames  Green Jr, M.D.  On: 10/07/2015 21:09   I have personally reviewed and evaluated these images and lab results as part of my medical decision-making.   EKG Interpretation   Date/Time:  Wednesday October 07 2015 16:40:46 EDT Ventricular Rate:  72 PR Interval:  196 QRS Duration: 108 QT Interval:  398 QTC Calculation: 435 R Axis:   26 Text Interpretation:  Sinus rhythm with sinus arrhythmia with occasional  Premature ventricular complexes Otherwise normal ECG Confirmed by Lincoln Brigham [phone]) on 10/07/2015 4:48:44 PM Also confirmed by Lincoln Brigham [phone]),  editor Wandalee Ferdinand [phone])  on 10/07/2015 4:52:39 PM      We'll increase lisinopril to 40 mg twice a day from 40 mg once a day MDM   Final diagnoses:  Essential hypertension         Nelva Nay, MD 10/07/15 2116

## 2015-10-19 ENCOUNTER — Encounter: Payer: Self-pay | Admitting: Internal Medicine

## 2015-10-20 ENCOUNTER — Encounter: Payer: Self-pay | Admitting: Internal Medicine

## 2015-10-20 ENCOUNTER — Ambulatory Visit (INDEPENDENT_AMBULATORY_CARE_PROVIDER_SITE_OTHER): Payer: Self-pay | Admitting: Internal Medicine

## 2015-10-20 VITALS — BP 138/62 | HR 53 | Temp 98.0°F | Wt 289.1 lb

## 2015-10-20 DIAGNOSIS — I10 Essential (primary) hypertension: Secondary | ICD-10-CM

## 2015-10-20 DIAGNOSIS — G4733 Obstructive sleep apnea (adult) (pediatric): Secondary | ICD-10-CM

## 2015-10-20 NOTE — Assessment & Plan Note (Signed)
He has very severe symptomatic sleep apnea, with an AHI of 30. He would benefit greatly from a CPAP machine but the orange card won't cover it. He tells me he may be able to afford one, so I've printed off the prescription and his sleep study results today.

## 2015-10-20 NOTE — Assessment & Plan Note (Signed)
He is doing an outstanding job controlling his hypertension with diet and exercise. Because he is at goal, and I don't think metoprolol is helping his blood pressure, we've stopped this medication today. He will continue on amlodipine 10 mg daily and lisinopril 40 mg daily, and I have strongly encouraged him to continue cutting out carbohydrates and walk 30 minutes a day. He also has severe obstructive sleep apnea, but has been unable to get a CPAP machine because the orange card does not cover this. I think a CPAP machine would do wonders for his hypertension, so I printed out a prescription for him today along with his sleep study results; he knows a pharmacist who may be able to help him get one.

## 2015-10-20 NOTE — Progress Notes (Signed)
Patient ID: Josemiguel Gries, male   DOB: [birth year], 55 y.o.   MRN: [id number] [facility] Subjective:   Patient ID: Taber Sweetser male   DOB: 10/05/1960 55 y.o.   MRN: [id number]  HPI: Mr.Kamaledin Carey is a 55 y.o. male with essential hypertension, obstructive sleep apnea, and obesity presents for follow-up of hypertension and obstructive sleep apnea.  Hypertension: His morning pressures are elevated to 170/100 but evening pressures are normal at 130/70. The metoprolol makes both of his hands tingle, so he stopped taking the evening dose. He is cutting out breads, and is actually lost 4 pounds in the last 3 weeks. He tried going to the gym for about 2 weeks, but didn't like it, so now he is walking 30 minutes a day.  Obstructive sleep apnea: He did a sleep study in 2013 that showed an AHI of 30. He was never able to get a CPAP machine because of his insurance. He tells me he snores at night, is very groggy during the day, and has morning headaches. He would like a CPAP machine.  He is not smoking and I have reviewed his medications with him today.  Review of Systems  Constitutional: Positive for weight loss. Negative for fever, chills and malaise/fatigue.  Respiratory: Negative for shortness of breath and wheezing.   Cardiovascular: Negative for chest pain, leg swelling and PND.  Gastrointestinal: Negative for abdominal pain.  Neurological: Positive for headaches.    Objective:  Physical Exam: Filed Vitals:   10/20/15 0935 10/20/15 1050  BP: 147/86 138/62  Pulse: 53   Temp: 98 F (36.7 C)   TempSrc: Oral   Weight: 289 lb 1.6 oz (131.135 kg)   SpO2: 98%    General: friendly [place] man resting in chair comfortably, appropriately conversational HEENT: no scleral icterus, extra-ocular muscles intact, oropharynx without lesions Cardiac: regular rate and rhythm, no rubs, murmurs or gallops Pulm: breathing well, clear to auscultation  bilaterally Ext: warm and well perfused, without pedal edema Skin: no rash, hair, or nail changes  Assessment & Plan:  Case discussed with Dr. Josem Kaufmann  Essential hypertension He is doing an outstanding job controlling his hypertension with diet and exercise. Because he is at goal, and I don't think metoprolol is helping his blood pressure, we've stopped this medication today. He will continue on amlodipine 10 mg daily and lisinopril 40 mg daily, and I have strongly encouraged him to continue cutting out carbohydrates and walk 30 minutes a day. He also has severe obstructive sleep apnea, but has been unable to get a CPAP machine because the orange card does not cover this. I think a CPAP machine would do wonders for his hypertension, so I printed out a prescription for him today along with his sleep study results; he knows a pharmacist who may be able to help him get one.  Obstructive sleep apnea hypopnea, severe He has very severe symptomatic sleep apnea, with an AHI of 30. He would benefit greatly from a CPAP machine but the orange card won't cover it. He tells me he may be able to afford one, so I've printed off the prescription and his sleep study results today.   Other Orders Orders Placed This Encounter  Procedures  . For home use only DME continuous positive airway pressure (CPAP)    Order Specific Question:  Patient has OSA or probable OSA    Answer:  Yes    Order Specific Question:  Is the patient currently using CPAP in the  home    Answer:  No    Order Specific Question:  If no (to question two) date of sleep study    Answer:  09/05/2011    Order Specific Question:  Date of face to face encounter    Answer:  10/20/2015    Order Specific Question:  Settings    Answer:  5-10    Order Specific Question:  Signs and symptoms of probable OSA  (select all that apply)    Answer:  Snoring    Order Specific Question:  Signs and symptoms of probable OSA  (select all that apply)    Answer:   Witnessed apneas    Order Specific Question:  Signs and symptoms of probable OSA  (select all that apply)    Answer:  Choking    Order Specific Question:  Signs and symptoms of probable OSA  (select all that apply)    Answer:  Gasping during sleep    Order Specific Question:  CPAP supplies needed    Answer:  Mask, headgear, cushions, filters, heated tubing and water chamber   Follow Up: Return in about 4 weeks (around 11/17/2015).

## 2015-10-22 NOTE — Progress Notes (Signed)
Case discussed with Dr. Flores at the time of the visit. We reviewed the resident's history and exam and pertinent patient test results. I agree with the assessment, diagnosis, and plan of care documented in the resident's note. 

## 2015-11-17 ENCOUNTER — Ambulatory Visit: Payer: Self-pay | Admitting: Internal Medicine

## 2015-11-17 ENCOUNTER — Encounter: Payer: Self-pay | Admitting: Internal Medicine

## 2016-02-24 ENCOUNTER — Ambulatory Visit (INDEPENDENT_AMBULATORY_CARE_PROVIDER_SITE_OTHER): Payer: Self-pay | Admitting: Internal Medicine

## 2016-02-24 ENCOUNTER — Encounter: Payer: Self-pay | Admitting: Internal Medicine

## 2016-02-24 ENCOUNTER — Telehealth: Payer: Self-pay | Admitting: *Deleted

## 2016-02-24 VITALS — BP 165/104 | HR 70 | Temp 98.1°F | Ht 66.0 in | Wt 288.3 lb

## 2016-02-24 DIAGNOSIS — Z0000 Encounter for general adult medical examination without abnormal findings: Secondary | ICD-10-CM

## 2016-02-24 DIAGNOSIS — E669 Obesity, unspecified: Secondary | ICD-10-CM

## 2016-02-24 DIAGNOSIS — G4733 Obstructive sleep apnea (adult) (pediatric): Secondary | ICD-10-CM

## 2016-02-24 DIAGNOSIS — Z79899 Other long term (current) drug therapy: Secondary | ICD-10-CM

## 2016-02-24 DIAGNOSIS — Z6841 Body Mass Index (BMI) 40.0 and over, adult: Secondary | ICD-10-CM

## 2016-02-24 DIAGNOSIS — Z23 Encounter for immunization: Secondary | ICD-10-CM

## 2016-02-24 DIAGNOSIS — I10 Essential (primary) hypertension: Secondary | ICD-10-CM

## 2016-02-24 MED ORDER — LISINOPRIL 40 MG PO TABS: 40.0000 mg | ORAL_TABLET | Freq: Every day | ORAL | 3 refills | Status: DC

## 2016-02-24 MED ORDER — AMLODIPINE BESYLATE 10 MG PO TABS: 10.0000 mg | ORAL_TABLET | Freq: Every day | ORAL | 3 refills | Status: DC

## 2016-02-24 NOTE — Progress Notes (Signed)
   CC: For his BP medicine refill.  HPI:  [name] is a 55 y.o.with PMHx of HTN, OSA, obesity  Came to the clinic to get his medicine refill.   He denies any complaint today.  On questioning he admits still getting some morning headaches and whenever he checks his blood pressure at home  It is normally high ranging between 150-170 systolic  And 90 -100 diastolic  In the morning. His evening reading stays with the normal limit.  He states that he ran out of his Norvasc for about a week now. He is still using his lisinopril regularly. He has a diagnosis of obstructive sleep apnea on a sleep study done in 2013 and unable to get a CPAP as his [employer] card cannot provide that.   He was offered a prescription for CPAP last time so he can take it to his friend Pharmacist, who said that he can help him get that. He denies getting any prescription during that visit. We found out today  That his sleep study was done in 2013 which is longer valid for CPAP prescription.  Past Medical History:  Diagnosis Date  . Abscess 10/2004 s/p I and D   peritonsilar  . Gout    no aspiration  . Hypertension   . Obesity   . Schistosomiasis 03/03/00    in urine    Review of Systems:  As per HPI  Physical Exam:  Vitals:   02/24/16 1045  BP: (!) 165/104  Pulse: 70  Temp: 98.1 F (36.7 C)  TempSrc: Oral  SpO2: 99%  Weight: 288 lb 4.8 oz (130.8 kg)  Height: 5\' 6"  (1.676 m)    General: Vital signs reviewed.  Patient is well-developed and well-nourished,obese, in no acute distress and cooperative with exam.  Head: Normocephalic and atraumatic. Eyes: EOMI, conjunctivae normal, no scleral icterus.  Neck: Supple, trachea midline, normal ROM, no JVD, masses, thyromegaly, or carotid bruit present.  Cardiovascular: RRR, S1 normal, S2 normal, no murmurs, gallops, or rubs. Pulmonary/Chest: Clear to auscultation bilaterally, no wheezes, rales, or rhonchi. Abdominal: Soft, non-tender, non-distended,  BS +, no masses, organomegaly, or guarding present.  Extremities: No lower extremity edema bilaterally,  pulses symmetric and intact bilaterally. No cyanosis or clubbing. Skin: Warm, dry and intact. No rashes or erythema. Psychiatric: Normal mood and affect. speech and behavior is normal. Cognition and memory are normal.   Assessment & Plan:   See Encounters Tab for problem based charting.  Patient seen with Shawn Lawrence

## 2016-02-24 NOTE — Assessment & Plan Note (Signed)
BP Readings from Last 3 Encounters:  02/24/16 (!) 165/104  10/20/15 138/62  10/07/15 141/89   He states that he ran out of his Norvasc for about a week now. He is still using his lisinopril regularly. He did took  his morning dose today. He admits still getting some morning headaches and whenever he checks his blood pressure at home  It is normally high ranging between 150-170 systolic  And 90 -100 diastolic  In the morning. His evening reading stays with the normal limit.    Assessment.  His morning headaches and increase in blood pressure most probably is due to hypoxia because of his sleep apnea.   Plan.  He should benefit  From using CPAP.  We will try to get him one after repeat split-night study. Continue with same medicine.  We provided him with a refill for Norvasc and lisinopril.

## 2016-02-24 NOTE — Patient Instructions (Signed)
It was pleasure taking care of you today. I did refill all your medicine today. You need to get another sleep study, as it was done in 2013 before. I am giving you a referral for this repeat sleep study today. Please F/U with us after your sleep study.

## 2016-02-24 NOTE — Assessment & Plan Note (Signed)
He was given a flu shot today. 

## 2016-02-24 NOTE — Assessment & Plan Note (Signed)
He has a pretty stable weight. We talked about healthy diet and regular exercise so he can lose some weight. That will also help with his obstructive sleep apnea.

## 2016-02-24 NOTE — Assessment & Plan Note (Signed)
He has a severe obstructive sleep apnea as per study done on August 15 2011. Never got a CPAP machine..  Patient states that he has a friend pharmacist who can help him get a CPAP machine if we can give him a prescription.  We called  And find out  That hs previous sleep study is no more valid.  He needs a new split-night study. We referred him for repeat split night study today.

## 2016-02-25 NOTE — Progress Notes (Addendum)
Internal Medicine Clinic Attending  I saw and evaluated the patient.  I personally confirmed the key portions of the history and exam documented by Dr. Amin and I reviewed pertinent patient test results.  The assessment, diagnosis, and plan were formulated together and I agree with the documentation in the resident's note. 

## 2016-03-31 ENCOUNTER — Telehealth: Payer: Self-pay | Admitting: Internal Medicine

## 2016-03-31 NOTE — Telephone Encounter (Signed)
A. REMINDER CALL, LMTCB °

## 2016-04-01 ENCOUNTER — Ambulatory Visit: Payer: Self-pay

## 2016-04-13 ENCOUNTER — Ambulatory Visit (HOSPITAL_BASED_OUTPATIENT_CLINIC_OR_DEPARTMENT_OTHER): Payer: Self-pay | Attending: Internal Medicine | Admitting: Internal Medicine

## 2016-04-13 VITALS — Ht 69.0 in | Wt 283.0 lb

## 2016-04-13 DIAGNOSIS — R5383 Other fatigue: Secondary | ICD-10-CM | POA: Insufficient documentation

## 2016-04-13 DIAGNOSIS — E669 Obesity, unspecified: Secondary | ICD-10-CM | POA: Insufficient documentation

## 2016-04-13 DIAGNOSIS — G4733 Obstructive sleep apnea (adult) (pediatric): Secondary | ICD-10-CM | POA: Insufficient documentation

## 2016-04-13 DIAGNOSIS — R51 Headache: Secondary | ICD-10-CM | POA: Insufficient documentation

## 2016-04-13 DIAGNOSIS — I10 Essential (primary) hypertension: Secondary | ICD-10-CM | POA: Insufficient documentation

## 2016-04-13 DIAGNOSIS — I493 Ventricular premature depolarization: Secondary | ICD-10-CM | POA: Insufficient documentation

## 2016-04-13 DIAGNOSIS — R0683 Snoring: Secondary | ICD-10-CM | POA: Insufficient documentation

## 2016-04-13 DIAGNOSIS — Z6841 Body Mass Index (BMI) 40.0 and over, adult: Secondary | ICD-10-CM | POA: Insufficient documentation

## 2016-04-23 DIAGNOSIS — G4733 Obstructive sleep apnea (adult) (pediatric): Secondary | ICD-10-CM

## 2016-04-23 NOTE — Procedures (Signed)
   Patient Name: Shawn Lawrence, Shawn Lawrence Study Date: 04/13/2016 Gender: Male D.O.B: [birth year] Age (years): 55 Referring Provider: Nischal Narendra Height (inches): 69 Interpreting Physician: Jetty Duhamellinton Analys Ryden MD, ABSM Weight (lbs): 283 RPSGT: Shelah LewandowskyGregory, Kenyon BMI: 42 MRN: [id number] Neck Size: 18.00  CLINICAL INFORMATION Sleep Study Type: NPSG Indication for sleep study: Fatigue, Hypertension, Morning Headaches, Obesity, OSA, Re-Evaluation, Snoring, Witnessed Apneas Epworth Sleepiness Score: 2  Most recent polysomnogram dated 08/15/2011 revealed an AHI of 87.8/h and RDI of 105.1/h. Most recent titration study dated 08/15/2011 was optimal at 13cm H2O with an AHI of 1.7/h.  SLEEP STUDY TECHNIQUE As per the AASM Manual for the Scoring of Sleep and Associated Events v2.3 (April 2016) with a hypopnea requiring 4% desaturations. The channels recorded and monitored were frontal, central and occipital EEG, electrooculogram (EOG), submentalis EMG (chin), nasal and oral airflow, thoracic and abdominal wall motion, anterior tibialis EMG, snore microphone, electrocardiogram, and pulse oximetry.  MEDICATIONS Medications self-administered by patient taken the night of the study : none reported  SLEEP ARCHITECTURE The study was initiated at 10:55:54 PM and ended at 4:59:54 AM. Sleep onset time was 21.6 minutes and the sleep efficiency was 50.3%. The total sleep time was 183.0 minutes. Stage REM latency was 298.5 minutes. The patient spent 47.81% of the night in stage N1 sleep, 46.17% in stage N2 sleep, 0.00% in stage N3 and 6.01% in REM. Alpha intrusion was absent. Supine sleep was 35.59%.  RESPIRATORY PARAMETERS The overall apnea/hypopnea index (AHI) was 23.6 per hour. There were 15 total apneas, including 14 obstructive, 1 central and 0 mixed apneas. There were 57 hypopneas and 157 RERAs. The AHI during Stage REM sleep was 43.6 per hour. AHI while supine was 32.2 per hour. The mean oxygen  saturation was 95.36%. The minimum SpO2 during sleep was 87.00%. Moderate snoring was noted during this study.  CARDIAC DATA The 2 lead EKG demonstrated sinus rhythm. The mean heart rate was 65.11 beats per minute. Other EKG findings include: PVCs.  LEG MOVEMENT DATA The total PLMS were 0 with a resulting PLMS index of 0.00. Associated arousal with leg movement index was 0.0 .  IMPRESSIONS - Moderate obstructive sleep apnea occurred during this study (AHI = 23.6/h). - There were insufficient early events to meet protocol requirement for split CPAP titration. - No significant central sleep apnea occurred during this study (CAI = 0.3/h). - Mild oxygen desaturation was noted during this study (Min O2 = 87.00%). - The patient snored with Moderate snoring volume. - EKG findings include PVCs. - Clinically significant periodic limb movements did not occur during sleep. No significant associated arousals. - Sleep was markedly fragmented with many brief arousals, mostly related to respiratory events.  DIAGNOSIS - Obstructive Sleep Apnea (327.23 [G47.33 ICD-10])  RECOMMENDATIONS - Therapeutic CPAP titration to determine optimal pressure required to alleviate sleep disordered breathing. - Positional therapy avoiding supine position during sleep. - Avoid alcohol, sedatives and other CNS depressants that may worsen sleep apnea and disrupt normal sleep architecture. - Sleep hygiene should be reviewed to assess factors that may improve sleep quality. - Weight management and regular exercise should be initiated or continued if appropriate.  [Electronically signed] 04/23/2016 12:29 PM  Jetty Duhamellinton Maddux Vanscyoc MD, ABSM Diplomate, American Board of Sleep Medicine   NPI: [id number]  Waymon BudgeYOUNG,Jimi Schappert D Diplomate, American Board of Sleep Medicine  ELECTRONICALLY SIGNED ON:  04/23/2016, 12:25 PM [facility] PH: [phone]   FX: [phone] ACCREDITED BY THE AMERICAN ACADEMY OF  SLEEP MEDICINE

## 2016-04-29 ENCOUNTER — Other Ambulatory Visit: Payer: Self-pay | Admitting: Internal Medicine

## 2016-04-29 DIAGNOSIS — G4733 Obstructive sleep apnea (adult) (pediatric): Secondary | ICD-10-CM

## 2016-05-17 ENCOUNTER — Ambulatory Visit: Payer: Self-pay

## 2016-05-18 ENCOUNTER — Ambulatory Visit: Payer: Self-pay

## 2016-07-04 ENCOUNTER — Other Ambulatory Visit: Payer: Self-pay

## 2016-07-04 ENCOUNTER — Other Ambulatory Visit: Payer: Self-pay | Admitting: *Deleted

## 2016-07-04 DIAGNOSIS — I10 Essential (primary) hypertension: Secondary | ICD-10-CM

## 2016-07-04 NOTE — Telephone Encounter (Signed)
Refill phoned into [facility]-they never received previous refill that has additional refills on it.Kingsley SpittleGoldston, Darlene Cassady1/22/20184:02 PM

## 2016-07-04 NOTE — Telephone Encounter (Signed)
lisinopril (PRINIVIL,ZESTRIL) 40 MG tablet, refill request [facility].

## 2016-07-05 MED ORDER — LISINOPRIL 40 MG PO TABS: 40.0000 mg | ORAL_TABLET | Freq: Every day | ORAL | 3 refills | Status: DC

## 2016-10-11 ENCOUNTER — Ambulatory Visit: Payer: Self-pay

## 2016-10-17 ENCOUNTER — Encounter: Payer: Self-pay | Admitting: Internal Medicine

## 2016-11-18 ENCOUNTER — Encounter: Payer: Self-pay | Admitting: *Deleted

## 2017-03-29 ENCOUNTER — Ambulatory Visit (INDEPENDENT_AMBULATORY_CARE_PROVIDER_SITE_OTHER): Payer: Self-pay | Admitting: Internal Medicine

## 2017-03-29 DIAGNOSIS — E669 Obesity, unspecified: Secondary | ICD-10-CM

## 2017-03-29 DIAGNOSIS — Z6841 Body Mass Index (BMI) 40.0 and over, adult: Secondary | ICD-10-CM

## 2017-03-29 DIAGNOSIS — Z23 Encounter for immunization: Secondary | ICD-10-CM

## 2017-03-29 DIAGNOSIS — Z79899 Other long term (current) drug therapy: Secondary | ICD-10-CM

## 2017-03-29 DIAGNOSIS — I10 Essential (primary) hypertension: Secondary | ICD-10-CM

## 2017-03-29 MED ORDER — LISINOPRIL 40 MG PO TABS: 40.0000 mg | ORAL_TABLET | Freq: Every day | ORAL | 1 refills | Status: DC

## 2017-03-29 MED ORDER — AMLODIPINE BESYLATE 10 MG PO TABS: 10.0000 mg | ORAL_TABLET | Freq: Every day | ORAL | 1 refills | Status: DC

## 2017-03-29 NOTE — Assessment & Plan Note (Addendum)
Patient is here for hypertension follow-up and medication refill. He is prescribed amlodipine 10 and lisinopril 40 mg daily. He ran out of his medication one week ago. Blood pressure today is elevated 164/101. He is asymptomatic. -- Refilled amlodipine 10 mg -- Refilled Lisinopril 40 mg -- F/u BMP  ADDENDUM: BMP within normal limits. Attempted to call patient with results, unable to leave VM.

## 2017-03-29 NOTE — Progress Notes (Signed)
   CC: HTN follow up  HPI:  Mr.Kamaledin Darrold SpanMohamedali is a 56 y.o. male with past medical history outlined below here for HTN follow up. For the details of today's visit, please refer to the assessment and plan.  Past Medical History:  Diagnosis Date  . Abscess 10/2004 s/p I and D   peritonsilar  . Gout    no aspiration  . Hypertension   . Obesity   . Schistosomiasis 03/03/00    in urine    Review of Systems  Neurological: Negative for dizziness, focal weakness and headaches.    Physical Exam:  Vitals:   03/29/17 1447  BP: (!) 164/101  Pulse: 69  Temp: 98.2 F (36.8 C)  TempSrc: Oral  SpO2: 100%  Weight: 296 lb 3.2 oz (134.4 kg)  Height: 5\' 8"  (1.727 m)   Constitutional: Obese, NAD, appears comfortable Cardiovascular: RRR, no murmurs, rubs, or gallops.  Pulmonary/Chest: CTAB, no wheezes, rales, or rhonchi.  Extremities: Warm and well perfused.  No edema.  Psychiatric: Normal mood and affect  Assessment & Plan:   See Encounters Tab for problem based charting.  Patient discussed with Dr. Sandre Kittyaines

## 2017-03-29 NOTE — Patient Instructions (Signed)
Mr. Darrold SpanMohamedali,  It was a pleasure to see you today. Please continue to take your medication as previously prescribed. I have sent refills to your pharmacy. I will call you with the results of your blood work. If you have any questions or concerns, call our clinic at [phone] or after hours call [phone] and ask for the internal medicine resident on call. Thank you!  - Dr. Antony ContrasGuilloud

## 2017-03-30 LAB — BMP8+ANION GAP
Anion Gap: 14 mmol/L (ref 10.0–18.0)
BUN / CREAT RATIO: 10 (ref 9–20)
BUN: 8 mg/dL (ref 6–24)
CALCIUM: 9.1 mg/dL (ref 8.7–10.2)
CHLORIDE: 104 mmol/L (ref 96–106)
CO2: 23 mmol/L (ref 20–29)
Creatinine, Ser: 0.82 mg/dL (ref 0.76–1.27)
GFR calc non Af Amer: 99 mL/min/{1.73_m2} (ref >59)
GFR, EST AFRICAN AMERICAN: 114 mL/min/{1.73_m2} (ref >59)
GLUCOSE: 89 mg/dL (ref 65–99)
Potassium: 3.8 mmol/L (ref 3.5–5.2)
Sodium: 141 mmol/L (ref 134–144)

## 2017-03-30 NOTE — Progress Notes (Signed)
Internal Medicine Clinic Attending  Case discussed with Dr. Antony ContrasGuilloud  at the time of the visit.  We reviewed the resident's history and exam and pertinent patient test results.  I agree with the assessment, diagnosis, and plan of care documented in the resident's note.  Unfortunately, BP elevated today off of meds, will resume meds and recheck BP.  Anne ShutterAlexander N Raines, MD

## 2017-05-11 ENCOUNTER — Ambulatory Visit: Payer: Self-pay

## 2017-10-25 ENCOUNTER — Other Ambulatory Visit: Payer: Self-pay | Admitting: *Deleted

## 2017-10-25 ENCOUNTER — Other Ambulatory Visit: Payer: Self-pay | Admitting: Internal Medicine

## 2017-10-25 DIAGNOSIS — I10 Essential (primary) hypertension: Secondary | ICD-10-CM

## 2017-10-25 MED ORDER — LISINOPRIL 40 MG PO TABS: 40.0000 mg | ORAL_TABLET | Freq: Every day | ORAL | 0 refills | Status: DC

## 2017-10-26 ENCOUNTER — Ambulatory Visit (INDEPENDENT_AMBULATORY_CARE_PROVIDER_SITE_OTHER): Payer: Self-pay | Admitting: Internal Medicine

## 2017-10-26 ENCOUNTER — Other Ambulatory Visit: Payer: Self-pay

## 2017-10-26 DIAGNOSIS — Z79899 Other long term (current) drug therapy: Secondary | ICD-10-CM

## 2017-10-26 DIAGNOSIS — R413 Other amnesia: Secondary | ICD-10-CM

## 2017-10-26 DIAGNOSIS — I10 Essential (primary) hypertension: Secondary | ICD-10-CM

## 2017-10-26 MED ORDER — LISINOPRIL 40 MG PO TABS: 40.0000 mg | ORAL_TABLET | Freq: Every day | ORAL | 1 refills | Status: DC

## 2017-10-26 MED ORDER — AMLODIPINE BESYLATE 10 MG PO TABS: 10.0000 mg | ORAL_TABLET | Freq: Every day | ORAL | 1 refills | Status: DC

## 2017-10-26 NOTE — Progress Notes (Signed)
   CC: Follow up for hypertension  HPI:  Mr.Kamaledin Virnig is a 57 y.o. male with PMHx detailed below presenting needing refills of his blood pressure medications. He has not taken them in the past 2 days. He is otherwise feeling in good health. 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  See problem based assessment and plan below for additional details.  Essential hypertension Mr. Stickler ran out of his medications 2 days ago and is here to get them refilled.  He was tolerating the amlodipine 10 mg and lisinopril 40 mg without any complaints.  On chart review it looks like a med refill request was sent to his PCP that may have been filled as a "no print" order, or perhaps he has not checked back with his pharmacy since this was addressed. He is hypertensive today at 166/101 off his medicines. Plan: Reordered amlodipine 10 mg and lisinopril 40 mg Can follow-up with his PCP in the fall   Past Medical History:  Diagnosis Date  . Abscess 10/2004 s/p I and D   peritonsilar  . Gout    no aspiration  . Hypertension   . Obesity   . Schistosomiasis 03/03/00    in urine    Review of Systems: Review of Systems  Constitutional: Negative for malaise/fatigue.  Eyes: Negative for blurred vision.  Respiratory: Negative for shortness of breath.   Cardiovascular: Negative for chest pain, palpitations and leg swelling.  Gastrointestinal: Negative for abdominal pain.  Neurological: Negative for dizziness and headaches.  Psychiatric/Behavioral: Positive for memory loss.     Physical Exam: Vitals:   10/26/17 1439  BP: (!) 166/101  Pulse: 66  Temp: 98.3 F (36.8 C)  TempSrc: Oral  SpO2: 96%  Weight: 288 lb 11.2 oz (131 kg)  Height:  (1.702 m)   GENERAL- alert, co-operative, NAD HEENT- Atraumatic, PERRL, oral mucosa appears moist CARDIAC- RRR, no murmurs, rubs or gallops. RESP- CTAB, no wheezes or  crackles. EXTREMITIES- pulse 2+, symmetric, no pedal edema. SKIN- Warm, dry, No rash or lesion. PSYCH- Normal mood and affect, appropriate thought content and speech.   Assessment & Plan:   See encounters tab for problem based medical decision making.   Patient discussed with Dr. Heide Spark

## 2017-10-26 NOTE — Patient Instructions (Signed)
I am glad you are feeling well today Shawn Lawrence. I have reordered your medicines for the next 6 months.  We can plan to see you back sometime in the Fall with your primary doctor to get caught up on healthcare maintenance.

## 2017-10-30 ENCOUNTER — Encounter: Payer: Self-pay | Admitting: Internal Medicine

## 2017-10-30 NOTE — Assessment & Plan Note (Signed)
Shawn Lawrence ran out of his medications 2 days ago and is here to get them refilled.  He was tolerating the amlodipine 10 mg and lisinopril 40 mg without any complaints.  On chart review it looks like a med refill request was sent to his PCP that may have been filled as a "no print" order, or perhaps he has not checked back with his pharmacy since this was addressed. He is hypertensive today at 166/101 off his medicines. Plan: Reordered amlodipine 10 mg and lisinopril 40 mg Can follow-up with his PCP in the fall

## 2017-10-30 NOTE — Progress Notes (Signed)
Internal Medicine Clinic Attending  Case discussed with Dr. Rice at the time of the visit.  We reviewed the resident's history and exam and pertinent patient test results.  I agree with the assessment, diagnosis, and plan of care documented in the resident's note.  

## 2017-11-29 ENCOUNTER — Encounter: Payer: Self-pay | Admitting: *Deleted

## 2017-12-07 ENCOUNTER — Encounter: Payer: Self-pay | Admitting: Internal Medicine

## 2017-12-07 ENCOUNTER — Ambulatory Visit: Payer: Self-pay

## 2018-02-26 ENCOUNTER — Ambulatory Visit (HOSPITAL_COMMUNITY)
Admission: EM | Admit: 2018-02-26 | Discharge: 2018-02-26 | Disposition: A | Discharge status: Home / Self Care | Payer: BLUE CROSS/BLUE SHIELD | Attending: Family Medicine | Admitting: Family Medicine

## 2018-02-26 ENCOUNTER — Encounter (HOSPITAL_COMMUNITY): Payer: Self-pay

## 2018-02-26 DIAGNOSIS — I10 Essential (primary) hypertension: Secondary | ICD-10-CM | POA: Diagnosis not present

## 2018-02-26 MED ORDER — METOPROLOL SUCCINATE ER 50 MG PO TB24: 50.0000 mg | ORAL_TABLET | Freq: Every day | ORAL | 3 refills | Status: DC

## 2018-02-26 NOTE — ED Notes (Signed)
At discharge, patient asked for medicine for head.

## 2018-02-26 NOTE — ED Triage Notes (Signed)
Pt presents with elevated blood pressure and headaches. 

## 2018-02-26 NOTE — ED Provider Notes (Signed)
[facility]    CSN: [id number] Arrival date & time: 02/26/18  1440     History   Chief Complaint Chief Complaint  Patient presents with  . Hypertension    HPI Shawn Lawrence is a 57 y.o. male.   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  He recently developed an occipital headache (yesterday) and noted that his blood pressure is not well controlled, measuring 180/105 this afternoon before he came in.  Patient has taken a variety of blood pressure medicines in the past.  He developed gout when he took thiazides.  He has been on lisinopril and amlodipine for quite a while.  He found that taking 10 mg of amlodipine made no difference, so he is taking 5 mg now along with a 40 mg of lisinopril.  He says that he needs help finding an internal medicine doctor to help follow his blood pressure.     Past Medical History:  Diagnosis Date  . Abscess 10/2004 s/p I and D   peritonsilar  . Gout    no aspiration  . Hypertension   . Obesity   . 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 Obstructive sleep apnea hypopnea, severe 08/15/2011    Class: Chronic  . Preventative health care 11/11/2010  . OBESITY NOS 07/10/2006  . Essential hypertension 07/10/2006    History reviewed. No pertinent surgical history.     Home Medications    Prior to Admission medications   Medication Sig Start Date End Date Taking? Authorizing Provider  amLODipine (NORVASC) 10 MG tablet Take 1 tablet (10 mg total) by mouth daily. IM Program. 10/26/17   Fuller Plan, MD  metoprolol succinate (TOPROL XL) 50 MG 24 hr tablet Take 1 tablet (50 mg total) by mouth daily. Take with or immediately following a meal. 02/26/18   Elvina Sidle, MD    Family History Family History  Problem Relation Age of Onset  . Diabetes Mother     Social History Social History   Tobacco Use  . Smoking  status: Never Smoker  . Smokeless tobacco: Never Used  Substance Use Topics  . Alcohol use: No  . Drug use: No     Allergies   Hctz [hydrochlorothiazide]   Review of Systems Review of Systems   Physical Exam Triage Vital Signs ED Triage Vitals  Enc Vitals Group     BP 02/26/18 1508 (!) 164/99     Pulse Rate 02/26/18 1508 79     Resp 02/26/18 1508 20     Temp 02/26/18 1508 98.1 F (36.7 C)     Temp Source 02/26/18 1508 Oral     SpO2 02/26/18 1508 99 %     Weight --      Height --      Head Circumference --      Peak Flow --      Pain Score 02/26/18 1509 6     Pain Loc --      Pain Edu? --      Excl. in GC? --    No data found.  Updated Vital Signs BP (!) 164/99 (BP Location: Right Arm)   Pulse 79   Temp 98.1 F (36.7 C) (Oral)   Resp 20   SpO2 99%   Visual Acuity Right Eye Distance:   Left Eye Distance:   Bilateral Distance:  Right Eye Near:   Left Eye Near:    Bilateral Near:     Physical Exam  Constitutional: He is oriented to person, place, and time. He appears well-developed and well-nourished.  HENT:  Right Ear: External ear normal.  Left Ear: External ear normal.  Mouth/Throat: Oropharynx is clear and moist.  Eyes: Pupils are equal, round, and reactive to light. Conjunctivae are normal.  Neck: Normal range of motion. Neck supple.  Cardiovascular: Normal rate, regular rhythm and normal heart sounds.  Pulmonary/Chest: Effort normal and breath sounds normal.  Musculoskeletal: Normal range of motion.  Neurological: He is alert and oriented to person, place, and time.  Skin: Skin is warm and dry.  Nursing note and vitals reviewed.    UC Treatments / Results  Labs (all labs ordered are listed, but only abnormal results are displayed) Labs Reviewed - No data to display  EKG None  Radiology No results found.  Procedures Procedures (including critical care time)  Medications Ordered in UC Medications - No data to display  Initial  Impression / Assessment and Plan / UC Course  I have reviewed the triage vital signs and the nursing notes.  Pertinent labs & imaging results that were available during my care of the patient were reviewed by me and considered in my medical decision making (see chart for details).    Final Clinical Impressions(s) / UC Diagnoses   Final diagnoses:  Essential hypertension   Discharge Instructions   None    ED Prescriptions    Medication Sig Dispense Auth. Provider   metoprolol succinate (TOPROL XL) 50 MG 24 hr tablet Take 1 tablet (50 mg total) by mouth daily. Take with or immediately following a meal. 30 tablet Elvina SidleLauenstein, Krishna Dancel, MD     Controlled Substance Prescriptions [place] Controlled Substance Registry consulted? Not Applicable   Elvina SidleLauenstein, Shanese Riemenschneider, MD 02/26/18 [phone]

## 2018-03-02 ENCOUNTER — Emergency Department (HOSPITAL_COMMUNITY): Payer: BLUE CROSS/BLUE SHIELD

## 2018-03-02 ENCOUNTER — Other Ambulatory Visit: Payer: Self-pay

## 2018-03-02 ENCOUNTER — Encounter (HOSPITAL_COMMUNITY): Payer: Self-pay

## 2018-03-02 ENCOUNTER — Emergency Department (HOSPITAL_COMMUNITY)
Admission: EM | Admit: 2018-03-02 | Discharge: 2018-03-02 | Disposition: A | Discharge status: Home / Self Care | Payer: BLUE CROSS/BLUE SHIELD | Attending: Emergency Medicine | Admitting: Emergency Medicine

## 2018-03-02 DIAGNOSIS — Z79899 Other long term (current) drug therapy: Secondary | ICD-10-CM | POA: Insufficient documentation

## 2018-03-02 DIAGNOSIS — I10 Essential (primary) hypertension: Secondary | ICD-10-CM | POA: Diagnosis not present

## 2018-03-02 DIAGNOSIS — R51 Headache: Secondary | ICD-10-CM | POA: Diagnosis not present

## 2018-03-02 LAB — CBC WITH DIFFERENTIAL/PLATELET
Abs Immature Granulocytes: 0.1 10*3/uL (ref 0.0–0.1)
BASOS PCT: 1 %
Basophils Absolute: 0.1 10*3/uL (ref 0.0–0.1)
EOS ABS: 0.3 10*3/uL (ref 0.0–0.7)
EOS PCT: 3 %
HCT: 52.9 % — ABNORMAL HIGH (ref 39.0–52.0)
HEMOGLOBIN: 16.8 g/dL (ref 13.0–17.0)
Immature Granulocytes: 1 %
Lymphocytes Relative: 43 %
Lymphs Abs: 3.9 10*3/uL (ref 0.7–4.0)
MCH: 27.6 pg (ref 26.0–34.0)
MCHC: 31.8 g/dL (ref 30.0–36.0)
MCV: 86.9 fL (ref 78.0–100.0)
MONO ABS: 0.5 10*3/uL (ref 0.1–1.0)
Monocytes Relative: 6 %
Neutro Abs: 4.2 10*3/uL (ref 1.7–7.7)
Neutrophils Relative %: 46 %
PLATELETS: 253 10*3/uL (ref 150–400)
RBC: 6.09 MIL/uL — ABNORMAL HIGH (ref 4.22–5.81)
RDW: 13.3 % (ref 11.5–15.5)
WBC: 9 10*3/uL (ref 4.0–10.5)

## 2018-03-02 LAB — BASIC METABOLIC PANEL
Anion gap: 8 (ref 5–15)
BUN: 6 mg/dL (ref 6–20)
CALCIUM: 9.3 mg/dL (ref 8.9–10.3)
CO2: 27 mmol/L (ref 22–32)
CREATININE: 0.76 mg/dL (ref 0.61–1.24)
Chloride: 105 mmol/L (ref 98–111)
GFR calc Af Amer: >60 mL/min (ref >60)
GFR calc non Af Amer: >60 mL/min (ref >60)
GLUCOSE: 112 mg/dL — AB (ref 70–99)
Potassium: 4.2 mmol/L (ref 3.5–5.1)
Sodium: 140 mmol/L (ref 135–145)

## 2018-03-02 MED ORDER — PROCHLORPERAZINE EDISYLATE 10 MG/2ML IJ SOLN
10.0000 mg | Freq: Once | INTRAMUSCULAR | Status: AC
  Administered 2018-03-02: 10 mg via INTRAVENOUS
  Filled 2018-03-02: qty 2

## 2018-03-02 MED ORDER — SODIUM CHLORIDE 0.9 % IV BOLUS
500.0000 mL | Freq: Once | INTRAVENOUS | Status: AC
  Administered 2018-03-02: 500 mL via INTRAVENOUS

## 2018-03-02 MED ORDER — DIPHENHYDRAMINE HCL 50 MG/ML IJ SOLN
12.5000 mg | Freq: Once | INTRAMUSCULAR | Status: AC
  Administered 2018-03-02: 12.5 mg via INTRAVENOUS
  Filled 2018-03-02: qty 1

## 2018-03-02 NOTE — ED Provider Notes (Signed)
[facility]ENC[facility] Provider Note   CSN: [id number] Arrival date & time: 03/02/18  1213     History   Chief Complaint Chief Complaint  Patient presents with  . Hypertension    HPI Norberto SorensonKamaleldin G Passero is a 57 y.o. male with a past medical history of hypertension, who presents to ED for evaluation of high blood pressure reading at home and headache.  States that he is noticed for the past 5 days, he has had a occipital headache that radiates down to his shoulders.  Describes the pain is aching.  He has been checking his blood pressure at home which reads in the 150s to 160s systolic.  Patient has been on blood pressure medication for about 15 years.  He was initially on HCTZ which exacerbated his gout.  He has since been on lisinopril and amlodipine.  Since 9/16 he was seen at the urgent care center and he now takes metoprolol 50 mg and amlodipine 10 mg.  He no longer takes lisinopril.  He is not taking any medication specifically for his headache.  He does admit to increased stress for the past 5 days as well as possibly increasing the salt in his diet.  He denies any head injuries or falls, numbness in arms or legs, fever, chest pain, shortness of breath, vision changes, photophobia or phonophobia, vomiting.  HPI  Past Medical History:  Diagnosis Date  . Abscess 10/2004 s/p I and D   peritonsilar  . Gout    no aspiration  . Hypertension   . Obesity   . 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 Obstructive sleep apnea hypopnea, severe 08/15/2011    Class: Chronic  . Preventative health care 11/11/2010  . OBESITY NOS 07/10/2006  . Essential hypertension 07/10/2006    History reviewed. No pertinent surgical history.      Home Medications    Prior to Admission medications   Medication Sig Start Date End Date Taking? Authorizing Provider  amLODipine (NORVASC) 10 MG tablet Take 1 tablet  (10 mg total) by mouth daily. IM Program. 10/26/17  Yes Rice, Jamesetta Orleanshristopher W, MD  metoprolol succinate (TOPROL XL) 50 MG 24 hr tablet Take 1 tablet (50 mg total) by mouth daily. Take with or immediately following a meal. 02/26/18  Yes Elvina SidleLauenstein, Kurt, MD    Family History Family History  Problem Relation Age of Onset  . Diabetes Mother     Social History Social History   Tobacco Use  . Smoking status: Never Smoker  . Smokeless tobacco: Never Used  Substance Use Topics  . Alcohol use: No  . Drug use: No     Allergies   Hctz [hydrochlorothiazide]   Review of Systems Review of Systems  Constitutional: Negative for appetite change, chills and fever.  HENT: Negative for ear pain, rhinorrhea, sneezing and sore throat.   Eyes: Negative for photophobia and visual disturbance.  Respiratory: Negative for cough, chest tightness, shortness of breath and wheezing.   Cardiovascular: Negative for chest pain and palpitations.  Gastrointestinal: Negative for abdominal pain, blood in stool, constipation, diarrhea, nausea and vomiting.  Genitourinary: Negative for dysuria, hematuria and urgency.  Musculoskeletal: Positive for myalgias.  Skin: Negative for rash.  Neurological: Positive for headaches. Negative for dizziness, weakness and light-headedness.     Physical Exam Updated Vital Signs BP (!) 150/95   Pulse 72   Temp 99 F (37.2 C) (  Oral)   Resp 19   Ht 5\' 9"  (1.753 m)   Wt 116.6 kg   SpO2 100%   BMI 37.95 kg/m   Physical Exam  Constitutional: He is oriented to person, place, and time. He appears well-developed and well-nourished. No distress.  HENT:  Head: Normocephalic and atraumatic.  Nose: Nose normal.  Eyes: Pupils are equal, round, and reactive to light. Conjunctivae and EOM are normal. Right eye exhibits no discharge. Left eye exhibits no discharge. No scleral icterus.  Neck: Normal range of motion. Neck supple. Muscular tenderness present.    No meningismus.    Cardiovascular: Normal rate, regular rhythm, normal heart sounds and intact distal pulses. Exam reveals no gallop and no friction rub.  No murmur heard. Pulmonary/Chest: Effort normal and breath sounds normal. No respiratory distress.  Abdominal: Soft. Bowel sounds are normal. He exhibits no distension. There is no tenderness. There is no guarding.  Musculoskeletal: Normal range of motion. He exhibits no edema.  Neurological: He is alert and oriented to person, place, and time. No cranial nerve deficit or sensory deficit. He exhibits normal muscle tone. Coordination normal.  Pupils reactive. No facial asymmetry noted. Cranial nerves appear grossly intact. Sensation intact to light touch on face, BUE and BLE. Strength 5/5 in BUE and BLE.   Skin: Skin is warm and dry. No rash noted.  Psychiatric: He has a normal mood and affect.  Nursing note and vitals reviewed.    ED Treatments / Results  Labs (all labs ordered are listed, but only abnormal results are displayed) Labs Reviewed  BASIC METABOLIC PANEL - Abnormal; Notable for the following components:      Result Value   Glucose, Bld 112 (*)    All other components within normal limits  CBC WITH DIFFERENTIAL/PLATELET - Abnormal; Notable for the following components:   RBC 6.09 (*)    HCT 52.9 (*)    All other components within normal limits    EKG None  Radiology Ct Head Wo Contrast  Result Date: 03/02/2018 CLINICAL DATA:  Headache. EXAM: CT HEAD WITHOUT CONTRAST TECHNIQUE: Contiguous axial images were obtained from the base of the skull through the vertex without intravenous contrast. COMPARISON:  CT scan of Nov 07, 2004. FINDINGS: Brain: 6 mm rounded hyperdense abnormality is noted within the anterior superior aspect of the third ventricle consistent with colloid cyst. This is not visualized on prior study. Ventricular size is within normal limits. No midline shift or mass effect is noted. There is no evidence of hemorrhage or acute  infarction. Vascular: No hyperdense vessel or unexpected calcification. Skull: Normal. Negative for fracture or focal lesion. Sinuses/Orbits: No acute finding. Other: None. 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 Ventricular size is within normal limits. No other intracranial abnormality is noted. Electronically Signed   By: Lupita Raider, M.D.   On: 03/02/2018 14:28    Procedures Procedures (including critical care time)  Medications Ordered in ED Medications  sodium chloride 0.9 % bolus 500 mL (0 mLs Intravenous Stopped 03/02/18 1506)  prochlorperazine (COMPAZINE) injection 10 mg (10 mg Intravenous Given 03/02/18 1513)  diphenhydrAMINE (BENADRYL) injection 12.5 mg (12.5 mg Intravenous Given 03/02/18 1513)     Initial Impression / Assessment and Plan / ED Course  I have reviewed the triage vital signs and the nursing notes.  Pertinent labs & imaging results that were available during my care of the patient were reviewed by me and considered in my  medical decision making (see chart for details).     57 year old male past medical history of hypertension presents to ED for evaluation of high blood pressure reading at home as well as headache.  Reports headache for the past 5 days.  Home blood pressure readings are 1 50-1 60 systolic.  He does admit to increased salt intake as well as stress.  He recently changed his blood pressure medications from lisinopril to metoprolol 50 mg and amlodipine 10 mg.  No specific medications for his headache.  Denies any head injury, loss of consciousness, numbness in arms or legs, vision changes, chest pain or shortness of breath.  On exam there are no neurological deficits noted.  He is ambulatory with a normal gait.  No meningismus noted.  He is afebrile.  Hypertensive to 150 systolic here. Lab work including CBC, BMP unremarkable.  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  Patient was given migraine cocktail with significant improvement in his symptoms.  Informed patient of the finding on his CT and advised him to follow-up with neurology for monitoring this.  Encouraged him to continue his current blood pressure medication regimen.  He states that he is able to follow-up with his primary care provider.  Advised to return to ED for any severe worsening symptoms. Patient discussed with my attending, Dr. Effie Shy.  Portions of this note were generated with Scientist, clinical (histocompatibility and immunogenetics). Dictation errors may occur despite best attempts at proofreading.   Final Clinical Impressions(s) / ED Diagnoses   Final diagnoses:  Hypertension, unspecified type    ED Discharge Orders         Ordered    Ambulatory referral to Neurology    Comments:  An appointment is requested in approximately: 1 week   03/02/18 1659           Dietrich Pates, PA-C 03/02/18 1702    Mancel Bale, MD 03/04/18 1003

## 2018-03-02 NOTE — ED Notes (Signed)
Pt care assumed, obtained verbal report.  Pt ambulated to the BR with steady gait.  He appears comfortable.

## 2018-03-02 NOTE — Discharge Instructions (Signed)
Please decrease your salt intake.  Take the medications prescribed to you by your primary care provider. Follow-up with the neurologist regarding your incidental finding on your CT. Return to ED for worsening symptoms, severe headache or chest pain, shortness of breath, vomiting or coughing up blood, numbness in arms or legs or trouble walking.

## 2018-03-02 NOTE — ED Triage Notes (Signed)
Pt reports he has been checking his BP at home with readings in the 160s/100s. Pt reports being on BP medication and taking it as prescribed. Also c/o headache and neck pain.

## 2018-03-16 ENCOUNTER — Encounter: Payer: Self-pay | Admitting: Medical

## 2018-03-16 ENCOUNTER — Ambulatory Visit: Payer: BLUE CROSS/BLUE SHIELD | Admitting: Medical

## 2018-03-16 ENCOUNTER — Ambulatory Visit
Admission: RE | Admit: 2018-03-16 | Discharge: 2018-03-16 | Disposition: A | Discharge status: Home / Self Care | Payer: BLUE CROSS/BLUE SHIELD | Source: Ambulatory Visit | Attending: Medical | Admitting: Medical

## 2018-03-16 VITALS — BP 150/110 | HR 77 | Temp 98.2°F | Ht 72.5 in | Wt 294.4 lb

## 2018-03-16 DIAGNOSIS — R29898 Other symptoms and signs involving the musculoskeletal system: Secondary | ICD-10-CM

## 2018-03-16 DIAGNOSIS — M542 Cervicalgia: Secondary | ICD-10-CM

## 2018-03-16 DIAGNOSIS — G479 Sleep disorder, unspecified: Secondary | ICD-10-CM

## 2018-03-16 DIAGNOSIS — R9431 Abnormal electrocardiogram [ECG] [EKG]: Secondary | ICD-10-CM | POA: Insufficient documentation

## 2018-03-16 DIAGNOSIS — G4733 Obstructive sleep apnea (adult) (pediatric): Secondary | ICD-10-CM

## 2018-03-16 DIAGNOSIS — Z1211 Encounter for screening for malignant neoplasm of colon: Secondary | ICD-10-CM

## 2018-03-16 DIAGNOSIS — I10 Essential (primary) hypertension: Secondary | ICD-10-CM | POA: Diagnosis not present

## 2018-03-16 LAB — POCT URINALYSIS DIP (PROADVANTAGE DEVICE)
BILIRUBIN UA: NEGATIVE
Glucose, UA: NEGATIVE mg/dL
Ketones, POC UA: NEGATIVE mg/dL
LEUKOCYTES UA: NEGATIVE
Nitrite, UA: NEGATIVE
PH UA: 6 (ref 5.0–8.0)
SPECIFIC GRAVITY, URINE: 1.015
Urobilinogen, Ur: NEGATIVE

## 2018-03-16 MED ORDER — AMLODIPINE BESYLATE 10 MG PO TABS: 10.0000 mg | ORAL_TABLET | Freq: Every day | ORAL | 1 refills | Status: DC

## 2018-03-16 MED ORDER — LISINOPRIL 40 MG PO TABS: 40.0000 mg | ORAL_TABLET | Freq: Every day | ORAL | 0 refills | Status: DC

## 2018-03-16 MED ORDER — CARVEDILOL 3.125 MG PO TABS: 3.1250 mg | ORAL_TABLET | Freq: Two times a day (BID) | ORAL | 3 refills | Status: DC

## 2018-03-16 MED ORDER — MELOXICAM 7.5 MG PO TABS: 7.5000 mg | ORAL_TABLET | Freq: Every day | ORAL | 0 refills | Status: DC

## 2018-03-16 NOTE — Addendum Note (Signed)
Addended by: Victorio Palm on: 03/16/2018 04:08 PM   Modules accepted: Orders

## 2018-03-16 NOTE — Progress Notes (Signed)
Subjective: Chief Complaint  Patient presents with  . New Patient (Initial Visit)    increased blood pressure    Here as a new patient.    Was seeing [facility].  Wanted new provider as he always had to see a different provider at last office.   Has had high blood pressure for more than 19 years.   In the last 2 weeks BP running higher than usually.   Not sleeping well, has some pains in neck.  Wakes up tired.  He usually weights 280lb.   He has noted some swelling in legs recently. No prior stress test or cardiology consult  His main concern today is neck pain and decreased range of motion for the last 2 to 3 months.  Wakes up with some numbness in arms lately. he denies injury or trauma.  No back pain.  No arm pain.  No paresthesias of arms.  Not exercising.   Works as an Airline pilot.     Has been cutting back on salt, trying to eat healthy.  He had a sleep study a few years ago but he says he was never given results or put on CPAP  No prior colonoscopy, declines for now   Past Medical History:  Diagnosis Date  . Abscess 10/2004 s/p I and D   peritonsilar  . Gout    no aspiration  . Hypertension   . Obesity   . 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 Hypertension Mother   . Heart disease Mother 33       died of MI  . Diabetes Father   . Kidney disease Father   . Hypertension Brother   . Hypertension Brother     ROS as in subjective    Objective: BP (!) 150/110   Pulse 77   Temp 98.2 F (36.8 C) (Oral)   Ht 6' 0.5" (1.842 m)   Wt 294 lb 6.4 oz (133.5 kg)   SpO2 96%   BMI 39.38 kg/m   Wt Readings from Last 3 Encounters:  03/16/18 294 lb 6.4 oz (133.5 kg)  03/02/18 257 lb (116.6 kg)  10/26/17 288 lb 11.2 oz (131 kg)   BP Readings from Last 3 Encounters:  03/16/18 (!) 150/110  03/02/18 (!) 146/94  02/26/18 (!) 164/99   General appearance: alert, no distress, WD/WN, [place] Oral  cavity: MMM, no lesions, small oral airway Neck: supple, large circumference, no lymphadenopathy, no thyromegaly, no masses, no bruits Heart: RRR, normal S1, S2, no murmurs Lungs: CTA bilaterally, no wheezes, rhonchi, or rales Abdomen: +bs, soft, non tender, non distended, no masses, no hepatomegaly, no splenomegaly Pulses: 2+ symmetric, upper and lower extremities, normal cap refill Extremities: No edema   Adult ECG Report  Indication: HTN  Rate: 71 bpm  Rhythm: normal sinus rhythm  QRS Axis: 4 degrees  PR Interval:  QRS Duration:  QTc:  Conduction Disturbances: voltage criteria for LVH  Other Abnormalities: none  Patient's cardiac risk factors are: hypertension, male gender and obesity (BMI >= 30 kg/m2).  EKG comparison: 09/2015  Narrative Interpretation: Q in III, LVH      Assessment: Encounter Diagnoses  Name Primary?  . Essential hypertension Yes  . Neck pain   . Decreased ROM of neck   . Obstructive sleep apnea hypopnea, severe   . Sleep disturbance   . Screen for colon cancer   . Abnormal EKG  Plan: Hypertension-not at goal.  He has for whatever reason not been placed on CPAP in the past.  We will send order for CPAP with auto titration, continue amlodipine and lisinopril but add carvedilol today.  Counseled on diet, exercise, salt restriction.  Neck pain, decreased range of motion neck-we will send for x-ray  Obstructive sleep apnea per 2017 study in chart record.  Will refer for CPAP.  Discussed risk and benefits.  Discussed need for weight loss  Sleep disturbance-likely related to neck pain and sleep apnea  Advised he consider colon cancer screening.  He declines at current  Verneda Skill was seen today for new patient (initial visit).  Diagnoses and all orders for this visit:  Essential hypertension -     EKG 12-Lead -     amLODipine (NORVASC) 10 MG tablet; Take 1 tablet (10 mg total) by mouth daily. IM Program.  Neck pain -     DG  Cervical Spine Complete; Future  Decreased ROM of neck -     DG Cervical Spine Complete; Future  Obstructive sleep apnea hypopnea, severe -     EKG 12-Lead  Sleep disturbance  Screen for colon cancer  Abnormal EKG  Other orders -     lisinopril (PRINIVIL,ZESTRIL) 40 MG tablet; Take 1 tablet (40 mg total) by mouth daily. -     carvedilol (COREG) 3.125 MG tablet; Take 1 tablet (3.125 mg total) by mouth 2 (two) times daily with a meal. -     meloxicam (MOBIC) 7.5 MG tablet; Take 1 tablet (7.5 mg total) by mouth daily.   Follow-up 1 month

## 2018-03-19 ENCOUNTER — Other Ambulatory Visit: Payer: Self-pay

## 2018-03-19 DIAGNOSIS — G4733 Obstructive sleep apnea (adult) (pediatric): Secondary | ICD-10-CM

## 2018-03-20 ENCOUNTER — Other Ambulatory Visit: Payer: BLUE CROSS/BLUE SHIELD

## 2018-03-20 ENCOUNTER — Other Ambulatory Visit: Payer: Self-pay

## 2018-03-20 DIAGNOSIS — R319 Hematuria, unspecified: Secondary | ICD-10-CM

## 2018-03-20 LAB — POCT URINALYSIS DIP (PROADVANTAGE DEVICE)
BILIRUBIN UA: NEGATIVE
Glucose, UA: NEGATIVE mg/dL
Ketones, POC UA: NEGATIVE mg/dL
Leukocytes, UA: NEGATIVE
NITRITE UA: NEGATIVE
PH UA: 6 (ref 5.0–8.0)
Specific Gravity, Urine: 1.025
UUROB: NEGATIVE

## 2018-04-06 ENCOUNTER — Telehealth: Payer: Self-pay

## 2018-04-06 NOTE — Telephone Encounter (Signed)
Patient did not show up for CPAP fitting with [facility].  I will try to call him

## 2018-04-06 NOTE — Telephone Encounter (Signed)
Left message on voicemail for patient to call back. 

## 2018-04-19 ENCOUNTER — Ambulatory Visit: Payer: BLUE CROSS/BLUE SHIELD | Admitting: Medical

## 2018-04-19 ENCOUNTER — Telehealth: Payer: Self-pay | Admitting: Medical

## 2018-04-19 NOTE — Telephone Encounter (Signed)
Spoke to [facility] and patient was a no show for CPAP appointment.

## 2018-04-19 NOTE — Telephone Encounter (Signed)
Please call [facility] for a CPAP compliance report.  Please ask them why we do not get automatic compliance reports on our patients 30 days after they started CPAP.  This should just be standard policy.

## 2018-04-23 ENCOUNTER — Ambulatory Visit (INDEPENDENT_AMBULATORY_CARE_PROVIDER_SITE_OTHER): Payer: Self-pay | Admitting: Medical

## 2018-04-23 ENCOUNTER — Encounter: Payer: Self-pay | Admitting: Medical

## 2018-04-23 VITALS — BP 140/96 | HR 68 | Temp 98.0°F | Resp 16 | Ht 72.0 in | Wt 295.4 lb

## 2018-04-23 DIAGNOSIS — M4810 Ankylosing hyperostosis [Forestier], site unspecified: Secondary | ICD-10-CM | POA: Insufficient documentation

## 2018-04-23 DIAGNOSIS — I10 Essential (primary) hypertension: Secondary | ICD-10-CM

## 2018-04-23 DIAGNOSIS — E669 Obesity, unspecified: Secondary | ICD-10-CM

## 2018-04-23 DIAGNOSIS — G4733 Obstructive sleep apnea (adult) (pediatric): Secondary | ICD-10-CM

## 2018-04-23 DIAGNOSIS — G479 Sleep disorder, unspecified: Secondary | ICD-10-CM

## 2018-04-23 DIAGNOSIS — M542 Cervicalgia: Secondary | ICD-10-CM

## 2018-04-23 MED ORDER — LISINOPRIL 40 MG PO TABS: 40.0000 mg | ORAL_TABLET | Freq: Every day | ORAL | 3 refills | Status: DC

## 2018-04-23 MED ORDER — AMLODIPINE BESYLATE 10 MG PO TABS: 10.0000 mg | ORAL_TABLET | Freq: Every day | ORAL | 1 refills | Status: DC

## 2018-04-23 MED ORDER — CARVEDILOL 3.125 MG PO TABS: 3.1250 mg | ORAL_TABLET | Freq: Two times a day (BID) | ORAL | 3 refills | Status: DC

## 2018-04-23 NOTE — Patient Instructions (Addendum)
Recommendations  High blood pressure  Continue amlodipine and lisinopril daily in the morning  Continue Coreg/carvedilol, but this should be a twice daily medication 1 tablet in the morning and evening for blood pressure  Plan to call us back in January so we can update the referral for CPAP device for sleep apnea  Your recent neck x-ray showed DISH, diffuse idiopathic skeletal hyperostosis.  In January if desired we can refer you to orthopedics for further consult on this.  In the meantime I recommend routine exercise, daily stretching routine  I would like you to set goals for the next 3 months for fitness and weight loss   Exercise  Please try and get 150 minutes of exercise weekly including things such as walking or tennis or hiking as well as weightbearing exercise like calisthenics and stretching   Dietary: Lets change strategies and try something that may work better than what you are currently doing  I want you to eat 3 meals a day +2 snacks, one midmorning snack and one mid afternoon snack  Breakfast You may eat 1 of the following  Omelette, which can include a small amount of cheese, and vegetables such as peppers, mushrooms, small pieces of [place] or chicken  Low sugar yogurt serving which can include some fruit such as berries  Egg whites or hard boiled egg and meat (1-2 strips of bacon, or small piece of [place] sausage or [place] bacon)   Mid-morning snack 1 fruit serving such as one of the following:  medium-sized apple  medium-sized orange,  Tangerine  1/2 banana   3/4 cup of fresh berries or frozen berries  A protein source such as one of the following:  8 almonds   small handful of walnuts or other nuts  small piece of cheese,  low sugar yogurt   Lunch A protein source such as 1 of the following: . 1 serving of beans such as black beans, pinto beans, green beans, or edamame (soy beans) . 1 meat serving such as 6 oz or deck of card size  serving of fish, skinless chicken, or [place], either grilled or baked preferably.   You can use some pork or beef, but limit this compared to fish, chicken or [place] Vegetable - Half of your plate should be a non-starchy vegetables!  So avoid white potatoes and corn.  Otherwise, eat a large portion of vegetables. . Avocado, cucumber, tomato, carrots, greens, lettuce, squash, okra, etc.  . Vegetables can include salad with olive oil/vinaigrette dressing   Mid-afternoon snack 1 fruit serving such as one of the following:  medium-sized apple  medium-sized orange,  Tangerine  1/2 banana   3/4 cup of fresh berries or frozen berries  A protein source such as one of the following:  8 almonds   small handful of walnuts or other nuts  small piece of cheese,  low sugar yogurt   Dinner A protein source such as 1 of the following: . 1 serving of beans such as black beans, pinto beans, green beans, or edamame (soy beans) . 1 meat serving such as 6 oz or deck of card size serving of fish, skinless chicken, or [place], either grilled or baked preferably.   You can use some pork or beef, but limit this compared to fish, chicken or [place] Vegetable - Half of your plate should be a non-starchy vegetables!  So avoid white potatoes and corn.  Otherwise, eat a large portion of vegetables. . Avocado, cucumber, tomato, carrots, greens,  lettuce, squash, okra, etc.  . Vegetables can include salad with olive oil/vinaigrette dressing   Beverages: Water Unsweet tea Home made juice with a juicer without sugar added other than small bit of honey or agave nectar Water with sugar free flavor such as [place]   AVOID.... For the time being I want you to cut out the following items completely: . Soda, sweet tea, juice, beer or wine or alcohol . ALL grains and breads including rice, pasta, bread, cereal . Sweets such as cake, candy, pies, chips, cookies, chocolate

## 2018-04-23 NOTE — Progress Notes (Signed)
Subjective: Chief Complaint  Patient presents with  . BP check    bp check    I saw him as a new patient on March 16, 2018.   Was seeing [facility].      At his last visit his blood pressure was not at goal.  He also had history of sleep apnea without ever utilizing CPAP.  At his last visit we continued amlodipine and lisinopril but added carvedilol.  He for whatever reason is only using the carvedilol once daily.  We also sent order for CPAP device auto titration.  Has had high blood pressure for more than 19 years.   At his last visit he complained of not sleeping well, has some pains in neck.  Wakes up tired.  He usually weights 280lb.   He has noted some swelling in legs recently. No prior stress test or cardiology consult  At his last visit we counseled on diet and exercise and salt restriction.  At that time he reported that he was not exercising.   Works as an Airline pilot.    Not exercising currently.  Has exercise machine in home.     He does note improvements in his neck pain since last visit.    Past Medical History:  Diagnosis Date  . Abscess 10/2004 s/p I and D   peritonsilar  . Gout    no aspiration  . Hypertension   . Obesity   . 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 Hypertension Mother   . Heart disease Mother 88       died of MI  . Diabetes Father   . Kidney disease Father   . Hypertension Brother   . Hypertension Brother    ROS as in subjective    Objective: BP (!) 140/96   Pulse 68   Temp 98 F (36.7 C) (Oral)   Resp 16   Ht 6' (1.829 m)   Wt 295 lb 6.4 oz (134 kg)   SpO2 97%   BMI 40.06 kg/m   Wt Readings from Last 3 Encounters:  04/23/18 295 lb 6.4 oz (134 kg)  03/16/18 294 lb 6.4 oz (133.5 kg)  03/02/18 257 lb (116.6 kg)   BP Readings from Last 3 Encounters:  04/23/18 (!) 140/96  03/16/18 (!) 150/110  03/02/18 (!) 146/94   General appearance: alert, no distress, WD/WN,  [place] Oral cavity: MMM, no lesions, small oral airway Neck: supple, large circumference, no lymphadenopathy, no thyromegaly, no masses, no bruits Heart: RRR, normal S1, S2, no murmurs Lungs: CTA bilaterally, no wheezes, rhonchi, or rales Pulses: 2+ symmetric, upper and lower extremities, normal cap refill Extremities: No edema    Assessment: Encounter Diagnoses  Name Primary?  . Essential hypertension Yes  . Obstructive sleep apnea hypopnea, severe   . Obesity with serious comorbidity, unspecified classification, unspecified obesity type   . Sleep disturbance   . Neck pain   . DISH (diffuse idiopathic skeletal hyperostosis)      Plan: Hypertension- reviewed his medications and proper dosing.  He had not been using Coreg BID.  C/t amlodipine and lisinopril but add carvedilol today.  Counseled on diet, exercise, salt restriction.  Neck pain, decreased range of motion neck-we will send for x-ray   Obstructive sleep apnea per 2017 study in chart record.  Referred for CPAP last visit.   He wants to pursue CPAP after 06/2018 due to insurance issues  currently.  Sleep disturbance-likely related to neck pain and sleep apnea   Obesity - counseled on diet, exercise, and gave specific recommendations today  Neck pain, DISH - he will consider orthopedics referral after first of the year, but counseled on stretching, exercise.  Verneda Skill was seen today for bp check.  Diagnoses and all orders for this visit:  Essential hypertension -     amLODipine (NORVASC) 10 MG tablet; Take 1 tablet (10 mg total) by mouth daily. IM Program.  Obstructive sleep apnea hypopnea, severe  Obesity with serious comorbidity, unspecified classification, unspecified obesity type  Sleep disturbance  Neck pain  DISH (diffuse idiopathic skeletal hyperostosis)  Other orders -     carvedilol (COREG) 3.125 MG tablet; Take 1 tablet (3.125 mg total) by mouth 2 (two) times daily with a meal. -      lisinopril (PRINIVIL,ZESTRIL) 40 MG tablet; Take 1 tablet (40 mg total) by mouth daily.     Follow-up with call back in 06/2018.

## 2018-05-08 ENCOUNTER — Other Ambulatory Visit: Payer: Self-pay | Admitting: Medical

## 2018-05-08 NOTE — Telephone Encounter (Signed)
Is this ok to refill?  

## 2018-05-09 ENCOUNTER — Ambulatory Visit: Payer: BLUE CROSS/BLUE SHIELD | Admitting: Neurology

## 2018-05-15 ENCOUNTER — Encounter: Payer: Self-pay | Admitting: Neurology

## 2018-06-20 ENCOUNTER — Ambulatory Visit: Payer: Self-pay | Admitting: Medical

## 2018-07-10 ENCOUNTER — Encounter: Payer: Self-pay | Admitting: Medical

## 2018-12-05 ENCOUNTER — Other Ambulatory Visit: Payer: Self-pay | Admitting: Medical

## 2018-12-05 ENCOUNTER — Telehealth: Payer: Self-pay | Admitting: Medical

## 2018-12-05 DIAGNOSIS — I10 Essential (primary) hypertension: Secondary | ICD-10-CM

## 2018-12-05 MED ORDER — CARVEDILOL 3.125 MG PO TABS: 3.1250 mg | ORAL_TABLET | Freq: Two times a day (BID) | ORAL | 0 refills | Status: DC

## 2018-12-05 MED ORDER — LISINOPRIL 40 MG PO TABS: 40.0000 mg | ORAL_TABLET | Freq: Every day | ORAL | 0 refills | Status: DC

## 2018-12-05 MED ORDER — AMLODIPINE BESYLATE 10 MG PO TABS: 10.0000 mg | ORAL_TABLET | Freq: Every day | ORAL | 0 refills | Status: DC

## 2018-12-05 NOTE — Telephone Encounter (Signed)
Pt come by and needs a refill on his bp medicine amlodipine pt needs it to go to [place], [place] - [address] Is going to have to switch PCP has he has the [facility] [employer]

## 2018-12-05 NOTE — Telephone Encounter (Signed)
Done, he will need to go ahead and establish care with new PCP within 30 days

## 2020-04-21 ENCOUNTER — Other Ambulatory Visit (HOSPITAL_COMMUNITY): Payer: Self-pay | Admitting: Internal Medicine

## 2020-05-10 ENCOUNTER — Other Ambulatory Visit: Payer: Self-pay

## 2020-05-10 ENCOUNTER — Emergency Department (HOSPITAL_COMMUNITY): Payer: BLUE CROSS/BLUE SHIELD

## 2020-05-10 ENCOUNTER — Encounter (HOSPITAL_COMMUNITY): Payer: Self-pay | Admitting: Emergency Medicine

## 2020-05-10 ENCOUNTER — Emergency Department (HOSPITAL_COMMUNITY)
Admission: EM | Admit: 2020-05-10 | Discharge: 2020-05-10 | Disposition: A | Discharge status: Home / Self Care | Payer: BLUE CROSS/BLUE SHIELD | Attending: Emergency Medicine | Admitting: Emergency Medicine

## 2020-05-10 DIAGNOSIS — M545 Low back pain, unspecified: Secondary | ICD-10-CM | POA: Diagnosis present

## 2020-05-10 DIAGNOSIS — R1030 Lower abdominal pain, unspecified: Secondary | ICD-10-CM | POA: Diagnosis not present

## 2020-05-10 DIAGNOSIS — Z79899 Other long term (current) drug therapy: Secondary | ICD-10-CM | POA: Diagnosis not present

## 2020-05-10 DIAGNOSIS — I10 Essential (primary) hypertension: Secondary | ICD-10-CM | POA: Insufficient documentation

## 2020-05-10 DIAGNOSIS — N200 Calculus of kidney: Secondary | ICD-10-CM | POA: Diagnosis not present

## 2020-05-10 LAB — URINALYSIS, ROUTINE W REFLEX MICROSCOPIC
Bacteria, UA: NONE SEEN
Bilirubin Urine: NEGATIVE
Glucose, UA: NEGATIVE mg/dL
Ketones, ur: NEGATIVE mg/dL
Leukocytes,Ua: NEGATIVE
Nitrite: NEGATIVE
Protein, ur: 100 mg/dL — AB
Specific Gravity, Urine: 1.015 (ref 1.005–1.030)
pH: 6 (ref 5.0–8.0)

## 2020-05-10 LAB — CBC
HCT: 49.4 % (ref 39.0–52.0)
Hemoglobin: 15.8 g/dL (ref 13.0–17.0)
MCH: 27.1 pg (ref 26.0–34.0)
MCHC: 32 g/dL (ref 30.0–36.0)
MCV: 84.9 fL (ref 80.0–100.0)
Platelets: 301 10*3/uL (ref 150–400)
RBC: 5.82 MIL/uL — ABNORMAL HIGH (ref 4.22–5.81)
RDW: 13.8 % (ref 11.5–15.5)
WBC: 9.9 10*3/uL (ref 4.0–10.5)
nRBC: 0 % (ref 0.0–0.2)

## 2020-05-10 LAB — BASIC METABOLIC PANEL
Anion gap: 10 (ref 5–15)
BUN: 9 mg/dL (ref 6–20)
CO2: 25 mmol/L (ref 22–32)
Calcium: 9 mg/dL (ref 8.9–10.3)
Chloride: 104 mmol/L (ref 98–111)
Creatinine, Ser: 1.27 mg/dL — ABNORMAL HIGH (ref 0.61–1.24)
GFR, Estimated: >60 mL/min (ref >60)
Glucose, Bld: 111 mg/dL — ABNORMAL HIGH (ref 70–99)
Potassium: 3.8 mmol/L (ref 3.5–5.1)
Sodium: 139 mmol/L (ref 135–145)

## 2020-05-10 MED ORDER — LIDOCAINE 5 % EX PTCH
1.0000 | MEDICATED_PATCH | CUTANEOUS | Status: DC
  Administered 2020-05-10: 1 via TRANSDERMAL
  Filled 2020-05-10: qty 1

## 2020-05-10 MED ORDER — ACETAMINOPHEN 500 MG PO TABS
1000.0000 mg | ORAL_TABLET | Freq: Once | ORAL | Status: AC
  Administered 2020-05-10: 1000 mg via ORAL
  Filled 2020-05-10: qty 2

## 2020-05-10 NOTE — Discharge Instructions (Signed)
You do not currently have a kidney stone but it looks like you recently had one and that most likely has passed.

## 2020-05-10 NOTE — ED Provider Notes (Signed)
[facility] EMERGENCY DEPARTMENT Provider Note   CSN: [id number] Arrival date & time: 05/10/20  1438     History Chief Complaint  Patient presents with  . Flank Pain    JARMAL LEWELLING is a 59 y.o. male presents with low back pain.  He states that he had about 5 days ago but it resolved.  He states that returned yesterday evening and is a sharp sensation in his lower back.  Does state that the pain initially was higher at rating around to his flanks but now is lower.  Associated with a bandlike tightening feeling around his lower abdomen and urinary frequency.  Denies any urgency, dysuria, or hematuria.  States that since he has been in the ED, his symptoms have resolved.  Denies any recent fevers, urinary retention, bowel/bladder incontinence, saddle anesthesia, or difficulty walking.  The history is provided by the patient.  Back Pain Location:  Lumbar spine Quality: Sharp. Radiates to: bandlike feeling around lower abdomen. Duration:  1 day (had pain 5 days ago, improved, worsened last night) Timing:  Constant Progression:  Resolved Chronicity:  New Context: not falling, not jumping from heights, not lifting heavy objects, not physical stress, not recent illness, not recent injury and not twisting   Relieved by: Tylenol. Worsened by:  Nothing Associated symptoms: abdominal pain   Associated symptoms: no bladder incontinence, no bowel incontinence, no chest pain, no dysuria, no fever, no leg pain, no numbness, no paresthesias, no pelvic pain, no perianal numbness, no tingling and no weakness        Past Medical History:  Diagnosis Date  . Abscess 10/2004 s/p I and D   peritonsilar  . Gout    no aspiration  . Hypertension   . Obesity   . 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 DISH (diffuse idiopathic skeletal hyperostosis) 04/23/2018  . Neck pain 03/16/2018  . Decreased ROM of neck  03/16/2018  . Sleep disturbance 03/16/2018  . Abnormal EKG 03/16/2018  . Obstructive sleep apnea hypopnea, severe 08/15/2011    Class: Chronic  . Screen for colon cancer 11/11/2010  . OBESITY NOS 07/10/2006  . Essential hypertension 07/10/2006    History reviewed. No pertinent surgical history.     Family History  Problem Relation Age of Onset  . Hypertension Mother   . Heart disease Mother 85       died of MI  . Diabetes Father   . Kidney disease Father   . Hypertension Brother   . Hypertension Brother     Social History   Tobacco Use  . Smoking status: Never Smoker  . Smokeless tobacco: Never Used  Vaping Use  . Vaping Use: Never used  Substance Use Topics  . Alcohol use: No  . Drug use: No    Home Medications Prior to Admission medications   Medication Sig Start Date End Date Taking? Authorizing Provider  amLODipine (NORVASC) 10 MG tablet Take 1 tablet (10 mg total) by mouth daily. IM Program. 12/05/18  Yes Tysinger, Kermit Balo, PA-C  Ascorbic Acid (VITAMIN C) 250 MG CHEW Chew 2 tablets by mouth daily.   Yes [provider]  carvedilol (COREG) 6.25 MG tablet Take 6.25 mg by mouth 2 (two) times daily. 05/04/20  Yes [provider]  cloNIDine (CATAPRES) 0.1 MG tablet Take 0.1 mg by mouth 2 (two) times daily. 04/21/20  Yes [provider]  lisinopril (  ZESTRIL) 40 MG tablet Take 1 tablet (40 mg total) by mouth daily. 12/05/18  Yes Tysinger, Kermit Baloavid S, PA-C  carvedilol (COREG) 3.125 MG tablet Take 1 tablet (3.125 mg total) by mouth 2 (two) times daily with a meal. Patient not taking: Reported on 05/10/2020 12/05/18   Tysinger, Kermit Baloavid S, PA-C  meloxicam (MOBIC) 7.5 MG tablet TAKE 1 TABLET (7.5 MG TOTAL) BY MOUTH DAILY. Patient not taking: Reported on 05/10/2020 05/09/18   Tysinger, Kermit Baloavid S, PA-C    Allergies    Hctz [hydrochlorothiazide]  Review of Systems   Review of Systems  Constitutional: Negative for chills and fever.  HENT: Negative for ear  pain and sore throat.   Eyes: Negative for pain and visual disturbance.  Respiratory: Negative for cough and shortness of breath.   Cardiovascular: Negative for chest pain and palpitations.  Gastrointestinal: Positive for abdominal pain. Negative for bowel incontinence, constipation, diarrhea, nausea and vomiting.  Genitourinary: Positive for frequency. Negative for bladder incontinence, dysuria, hematuria, pelvic pain and urgency.  Musculoskeletal: Positive for back pain. Negative for arthralgias.  Skin: Negative for color change and rash.  Neurological: Negative for tingling, seizures, syncope, weakness, numbness and paresthesias.  All other systems reviewed and are negative.   Physical Exam Updated Vital Signs BP (!) 142/90 (BP Location: Right Arm)   Pulse 68   Temp 98 F (36.7 C) (Oral)   Resp 18   SpO2 99%   Physical Exam Vitals and nursing note reviewed.  Constitutional:      General: He is not in acute distress.    Appearance: He is well-developed. He is obese. He is not ill-appearing or toxic-appearing.  HENT:     Head: Normocephalic and atraumatic.     Right Ear: External ear normal.     Left Ear: External ear normal.  Eyes:     Extraocular Movements: Extraocular movements intact.     Conjunctiva/sclera: Conjunctivae normal.     Pupils: Pupils are equal, round, and reactive to light.  Cardiovascular:     Rate and Rhythm: Normal rate and regular rhythm.     Heart sounds: No murmur heard.   Pulmonary:     Effort: Pulmonary effort is normal. No respiratory distress.     Breath sounds: Normal breath sounds.  Abdominal:     General: There is no distension.     Palpations: Abdomen is soft.     Tenderness: There is no abdominal tenderness. There is no right CVA tenderness, left CVA tenderness, guarding or rebound.  Musculoskeletal:        General: No swelling, deformity or signs of injury.     Cervical back: Normal and neck supple.     Thoracic back: Normal.      Lumbar back: Tenderness present. No swelling, edema, deformity, lacerations, spasms or bony tenderness. Normal range of motion.       Back:     Right lower leg: No edema.     Left lower leg: No edema.  Skin:    General: Skin is warm and dry.  Neurological:     General: No focal deficit present.     Mental Status: He is alert and oriented to person, place, and time. Mental status is at baseline.     Cranial Nerves: No cranial nerve deficit.     Sensory: No sensory deficit.     Motor: No weakness.     Coordination: Coordination normal.     Gait: Gait normal.     ED Results /  Procedures / Treatments   Labs (all labs ordered are listed, but only abnormal results are displayed) Labs Reviewed  URINALYSIS, ROUTINE W REFLEX MICROSCOPIC - Abnormal; Notable for the following components:      Result Value   APPearance HAZY (*)    Hgb urine dipstick SMALL (*)    Protein, ur 100 (*)    All other components within normal limits  BASIC METABOLIC PANEL - Abnormal; Notable for the following components:   Glucose, Bld 111 (*)    Creatinine, Ser 1.27 (*)    All other components within normal limits  CBC - Abnormal; Notable for the following components:   RBC 5.82 (*)    All other components within normal limits    EKG None  Radiology CT ABDOMEN PELVIS WO CONTRAST  Result Date: 05/10/2020 CLINICAL DATA:  Bilateral flank pain. EXAM: CT ABDOMEN AND PELVIS WITHOUT CONTRAST TECHNIQUE: Multidetector CT imaging of the abdomen and pelvis was performed following the standard protocol without IV contrast. COMPARISON:  None. FINDINGS: Lower chest: No acute abnormality. Hepatobiliary: There is diffuse fatty infiltration of the liver parenchyma. No focal liver abnormality is seen. No gallstones, gallbladder wall thickening, or biliary dilatation. Pancreas: Unremarkable. No pancreatic ductal dilatation or surrounding inflammatory changes. Spleen: Normal in size without focal abnormality. Adrenals/Urinary  Tract: Adrenal glands are unremarkable. Kidneys are normal in size, without focal lesions. There is mild right-sided hydronephrosis and hydroureter with mild periureteral and peripelvic inflammatory fat stranding. No renal calculi are identified. Bladder is unremarkable. Stomach/Bowel: Stomach is within normal limits. Appendix appears normal. No evidence of bowel dilatation. Noninflamed diverticula are seen throughout the large bowel. Vascular/Lymphatic: No significant vascular findings are present. No enlarged abdominal or pelvic lymph nodes. Reproductive: Prostate is unremarkable. Other: No abdominal wall hernia or abnormality. No abdominopelvic ascites. Musculoskeletal: No acute or significant osseous findings. IMPRESSION: 1. Findings likely consistent with a recently passed right renal stone. Sequelae associated with acute pyelonephritis cannot be excluded. 2. Hepatic steatosis. 3. Colonic diverticulosis. Electronically Signed   By: Aram Candela M.D.   On: 05/10/2020 18:35    Procedures Procedures (including critical care time)  Medications Ordered in ED Medications  acetaminophen (TYLENOL) tablet 1,000 mg (1,000 mg Oral Given 05/10/20 1731)    ED Course  I have reviewed the triage vital signs and the nursing notes.  Pertinent labs & imaging results that were available during my care of the patient were reviewed by me and considered in my medical decision making (see chart for details).    MDM Rules/Calculators/A&P                         MDM: Coe Angelos is a 59 y.o. male who presents with back pain as per above. I have reviewed the nursing documentation for past medical history, family history, and social history. Pertinent previous records reviewed. He is awake, alert. HDS. Afebrile. Physical exam is most notable for mild point tenderness over his right lower back.  Labs: CBC and BMP unremarkable.  UA with 21-50 RBCs but otherwise unremarkable. Imaging: CT abdomen/pelvis  without contrast demonstrating Findings likely consistent with recently passed right renal stone Consults: none Tx: Tylenol, Lidoderm patch  Differential Dx: I am most concerned for recent nephrolithiasis that he is not passed. Given history, physical exam, and work-up, I do not think he has pyonephritis, cystitis, cauda equina, epidural abscess, intra-abdominal pathology, or fracture/malalignment of the spine.  MDM: SOLOMAN MCKEITHAN is a 59 y.o. male  presents with back pain that was initially higher in his back radiating to his flanks now and is lower back but is now resolved.  This is consistent with nephrolithiasis at likely resulted in pain 5 days ago in his proximal ureters with his lower back pain likely consistent with a stone in his bladder.  Since his symptoms have resolved, felt that he likely passed stone.  Residual hydronephrosis on CT scan consistent with recent nephrolithiasis.  UA and exam not consistent with pyelonephritis.  Patient overall well-appearing on exam, normal gait.  Thought process and work-up discussed with patient who voiced understanding.  As patient is asymptomatic at this time and likely passed a stone, do not feel that further ED work-up or narcotic pain medication are indicated at this time.  Stable for discharge home.  Strict return precautions provided. Encouraged him to follow-up with his PCP on an outpatient basis. Questions were answered. Patient discharged in stable condition.  The plan for this patient was discussed with Dr. Bernette Mayers, who voiced agreement and who oversaw evaluation and treatment of this patient.   Final Clinical Impression(s) / ED Diagnoses Final diagnoses:  Nephrolithiasis    Rx / DC Orders ED Discharge Orders    None       Navarre Diana, MD 05/11/20 0106    Pollyann Savoy, MD 05/11/20 1103

## 2020-05-10 NOTE — ED Notes (Signed)
Pt discharge instructions and follow up care reviewed with the patient. The patient verbalized understanding. Pt discharged. °

## 2020-05-10 NOTE — ED Triage Notes (Signed)
C/o bilateral flank pain since last night and decreased urination.

## 2020-06-08 ENCOUNTER — Other Ambulatory Visit (HOSPITAL_COMMUNITY): Payer: Self-pay | Admitting: Internal Medicine

## 2020-07-01 ENCOUNTER — Other Ambulatory Visit (HOSPITAL_COMMUNITY): Payer: Self-pay | Admitting: Internal Medicine

## 2020-09-14 ENCOUNTER — Other Ambulatory Visit: Payer: Self-pay | Admitting: Internal Medicine

## 2020-09-14 ENCOUNTER — Other Ambulatory Visit (HOSPITAL_COMMUNITY): Payer: Self-pay

## 2020-09-14 MED FILL — Carvedilol Tab 6.25 MG: ORAL | 30 days supply | Qty: 60 | Fill #0 | Status: AC

## 2020-09-15 ENCOUNTER — Other Ambulatory Visit (HOSPITAL_COMMUNITY): Payer: Self-pay

## 2020-09-15 ENCOUNTER — Other Ambulatory Visit: Payer: Self-pay | Admitting: Internal Medicine

## 2020-09-16 ENCOUNTER — Other Ambulatory Visit (HOSPITAL_COMMUNITY): Payer: Self-pay

## 2020-09-16 MED ORDER — AMLODIPINE BESYLATE 10 MG PO TABS
10.0000 mg | ORAL_TABLET | Freq: Every day | ORAL | 2 refills | Status: DC
  Filled 2020-09-16: qty 30, 30d supply, fill #0
  Filled 2020-10-19: qty 30, 30d supply, fill #1
  Filled 2020-11-20: qty 30, 30d supply, fill #2

## 2020-09-16 MED ORDER — CLONIDINE HCL 0.1 MG PO TABS
0.1000 mg | ORAL_TABLET | Freq: Two times a day (BID) | ORAL | 2 refills | Status: DC
Start: 2020-09-16 — End: 2024-05-05
  Filled 2020-09-16: qty 60, 30d supply, fill #0

## 2020-09-16 MED ORDER — LISINOPRIL 40 MG PO TABS
40.0000 mg | ORAL_TABLET | Freq: Every day | ORAL | 2 refills | Status: DC
  Filled 2020-09-16: qty 30, 30d supply, fill #0
  Filled 2020-10-19: qty 30, 30d supply, fill #1
  Filled 2020-11-20: qty 30, 30d supply, fill #2

## 2020-10-19 ENCOUNTER — Other Ambulatory Visit (HOSPITAL_COMMUNITY): Payer: Self-pay

## 2020-11-20 ENCOUNTER — Other Ambulatory Visit (HOSPITAL_COMMUNITY): Payer: Self-pay

## 2020-12-03 ENCOUNTER — Other Ambulatory Visit (HOSPITAL_COMMUNITY): Payer: Self-pay

## 2020-12-03 MED ORDER — CLONIDINE HCL 0.1 MG PO TABS
0.1000 mg | ORAL_TABLET | Freq: Two times a day (BID) | ORAL | 0 refills | Status: DC
Start: 2020-12-03 — End: 2021-04-20
  Filled 2020-12-03: qty 180, 90d supply, fill #0

## 2020-12-03 MED ORDER — AMLODIPINE BESYLATE 10 MG PO TABS
1.0000 | ORAL_TABLET | Freq: Every day | ORAL | 0 refills | Status: DC
Start: 2020-12-03 — End: 2021-04-05
  Filled 2020-12-03 – 2020-12-07 (×3): qty 90, 90d supply, fill #0

## 2020-12-03 MED ORDER — CARVEDILOL 6.25 MG PO TABS
6.2500 mg | ORAL_TABLET | Freq: Two times a day (BID) | ORAL | 0 refills | Status: DC
Start: 2020-12-03 — End: 2021-04-20
  Filled 2020-12-03: qty 180, 90d supply, fill #0

## 2020-12-03 MED ORDER — LISINOPRIL 40 MG PO TABS
40.0000 mg | ORAL_TABLET | Freq: Every day | ORAL | 0 refills | Status: DC
Start: 2020-12-03 — End: 2021-04-05
  Filled 2020-12-03 – 2020-12-07 (×3): qty 90, 90d supply, fill #0

## 2020-12-07 ENCOUNTER — Other Ambulatory Visit (HOSPITAL_COMMUNITY): Payer: Self-pay

## 2021-04-05 ENCOUNTER — Other Ambulatory Visit (HOSPITAL_COMMUNITY): Payer: Self-pay

## 2021-04-05 MED ORDER — AMLODIPINE BESYLATE 10 MG PO TABS
10.0000 mg | ORAL_TABLET | Freq: Every day | ORAL | 0 refills | Status: DC
  Filled 2021-04-05: qty 90, 90d supply, fill #0

## 2021-04-05 MED ORDER — LISINOPRIL 40 MG PO TABS
40.0000 mg | ORAL_TABLET | Freq: Every day | ORAL | 0 refills | Status: DC
  Filled 2021-04-05: qty 90, 90d supply, fill #0

## 2021-04-20 ENCOUNTER — Other Ambulatory Visit (HOSPITAL_COMMUNITY): Payer: Self-pay

## 2021-04-20 MED ORDER — CLONIDINE HCL 0.1 MG PO TABS
0.1000 mg | ORAL_TABLET | Freq: Two times a day (BID) | ORAL | 0 refills | Status: DC
  Filled 2021-04-20: qty 180, 90d supply, fill #0

## 2021-04-20 MED ORDER — CARVEDILOL 6.25 MG PO TABS
6.2500 mg | ORAL_TABLET | Freq: Two times a day (BID) | ORAL | 0 refills | Status: DC
  Filled 2021-04-20: qty 180, 90d supply, fill #0

## 2021-07-19 ENCOUNTER — Other Ambulatory Visit (HOSPITAL_COMMUNITY): Payer: Self-pay

## 2021-07-19 MED ORDER — AMLODIPINE BESYLATE 10 MG PO TABS
10.0000 mg | ORAL_TABLET | Freq: Every day | ORAL | 0 refills | Status: DC
  Filled 2021-07-19: qty 90, 90d supply, fill #0

## 2021-07-19 MED ORDER — LISINOPRIL 40 MG PO TABS
40.0000 mg | ORAL_TABLET | Freq: Every day | ORAL | 0 refills | Status: DC
  Filled 2021-07-19: qty 90, 90d supply, fill #0

## 2021-07-20 ENCOUNTER — Other Ambulatory Visit (HOSPITAL_COMMUNITY): Payer: Self-pay

## 2021-11-01 ENCOUNTER — Other Ambulatory Visit (HOSPITAL_COMMUNITY): Payer: Self-pay

## 2021-11-01 MED ORDER — LISINOPRIL 40 MG PO TABS
40.0000 mg | ORAL_TABLET | Freq: Every day | ORAL | 0 refills | Status: DC
  Filled 2021-11-01: qty 90, 90d supply, fill #0

## 2021-11-01 MED ORDER — AMLODIPINE BESYLATE 10 MG PO TABS
10.0000 mg | ORAL_TABLET | Freq: Every day | ORAL | 0 refills | Status: DC
  Filled 2021-11-01: qty 90, 90d supply, fill #0

## 2022-02-08 ENCOUNTER — Other Ambulatory Visit (HOSPITAL_COMMUNITY): Payer: Self-pay

## 2022-02-08 MED ORDER — AMLODIPINE BESYLATE 10 MG PO TABS
10.0000 mg | ORAL_TABLET | Freq: Every day | ORAL | 0 refills | Status: DC
  Filled 2022-02-08: qty 90, 90d supply, fill #0

## 2022-02-08 MED ORDER — LISINOPRIL 40 MG PO TABS
40.0000 mg | ORAL_TABLET | Freq: Every day | ORAL | 0 refills | Status: DC
  Filled 2022-02-08: qty 90, 90d supply, fill #0

## 2022-02-08 MED ORDER — CLONIDINE HCL 0.1 MG PO TABS
0.1000 mg | ORAL_TABLET | Freq: Two times a day (BID) | ORAL | 0 refills | Status: DC
  Filled 2022-02-08: qty 180, 90d supply, fill #0

## 2022-02-08 MED ORDER — CARVEDILOL 6.25 MG PO TABS
6.2500 mg | ORAL_TABLET | Freq: Two times a day (BID) | ORAL | 0 refills | Status: DC
  Filled 2022-02-08: qty 180, 90d supply, fill #0

## 2022-04-08 ENCOUNTER — Other Ambulatory Visit (HOSPITAL_COMMUNITY): Payer: Self-pay

## 2022-04-08 MED ORDER — DICLOFENAC SODIUM 1 % EX GEL
Freq: Four times a day (QID) | CUTANEOUS | 3 refills | Status: DC
  Filled 2022-04-08: qty 100, 30d supply, fill #0

## 2022-08-05 IMAGING — CT CT ABD-PELV W/O CM
2 of 4 series · 16 of 46 positions shown, 18 images · non-contrast
Comparison: None.

CLINICAL DATA: Bilateral flank pain.

EXAM:
CT ABDOMEN AND PELVIS WITHOUT CONTRAST
TECHNIQUE: Multidetector CT imaging of the abdomen and pelvis was performed
following the standard protocol without IV contrast.

[Series 3: abd/ pelvis 5.0 i30f 2 · axial · 0.98mm/px · z∈[-492,-2]mm · 13 of 110 slices shown, 15 images]
[im 6/110  soft-tissue]
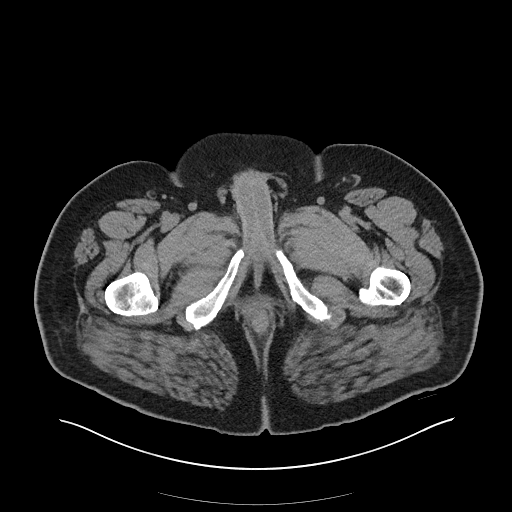
[im 6/110  bone]
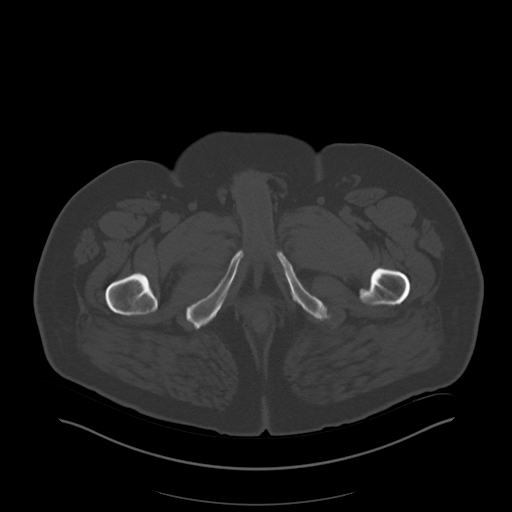
[im 16/110  soft-tissue]
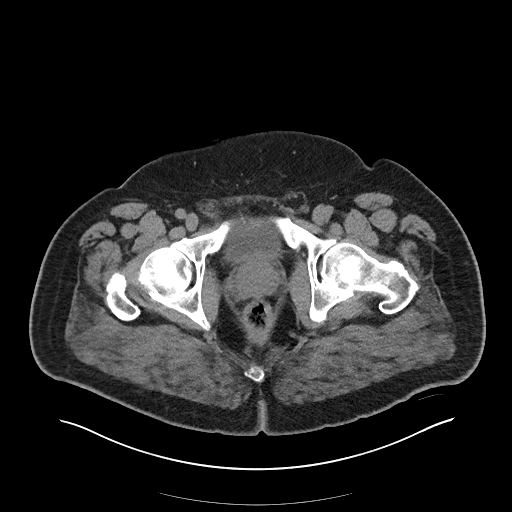
[im 21/110  soft-tissue]
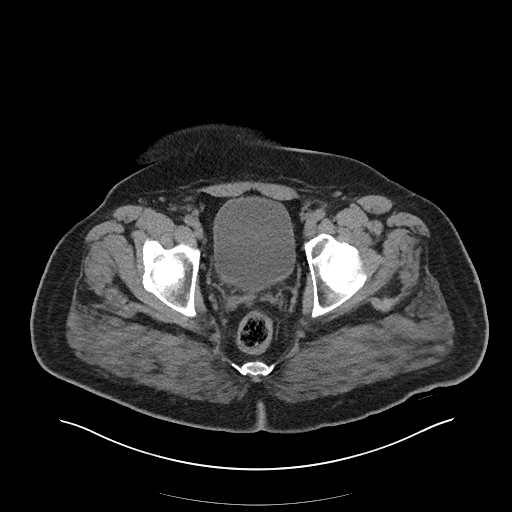
[im 32/110  soft-tissue]
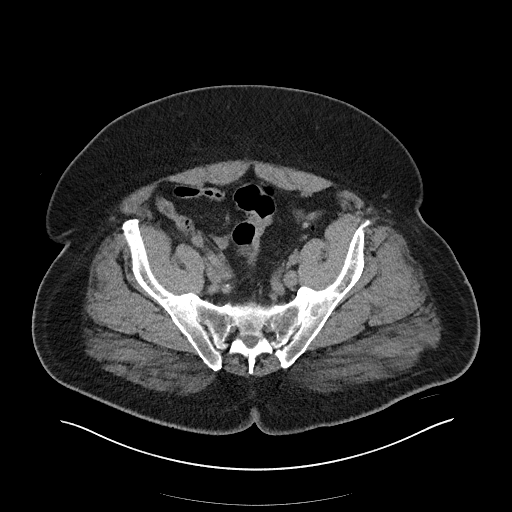
[im 37/110  soft-tissue]
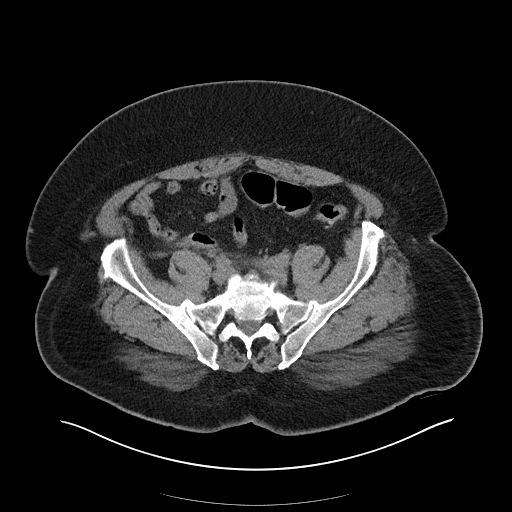
[im 47/110  soft-tissue]
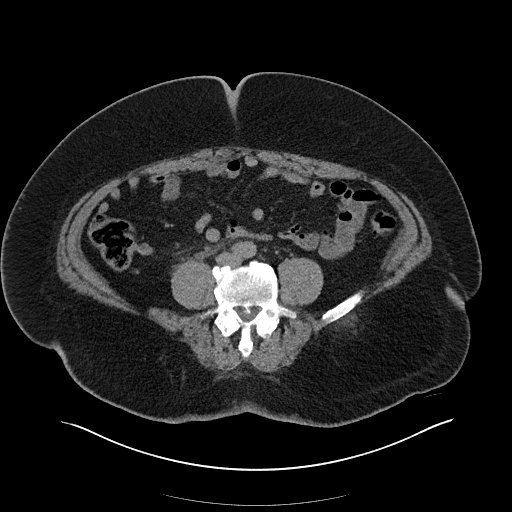
[im 58/110  soft-tissue]
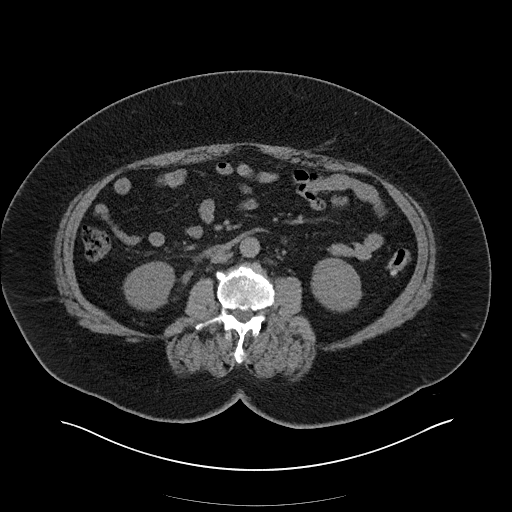
[im 63/110  soft-tissue]
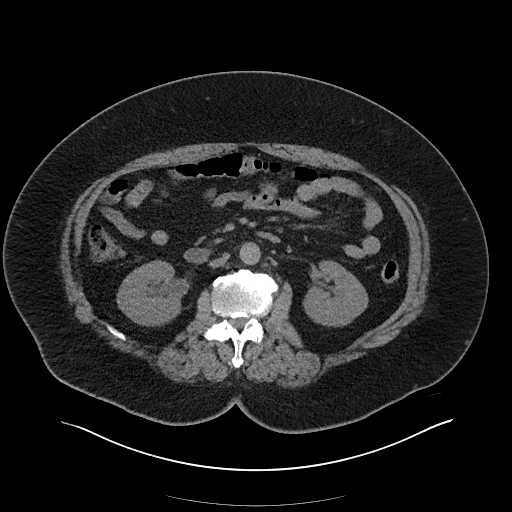
[im 73/110  soft-tissue]
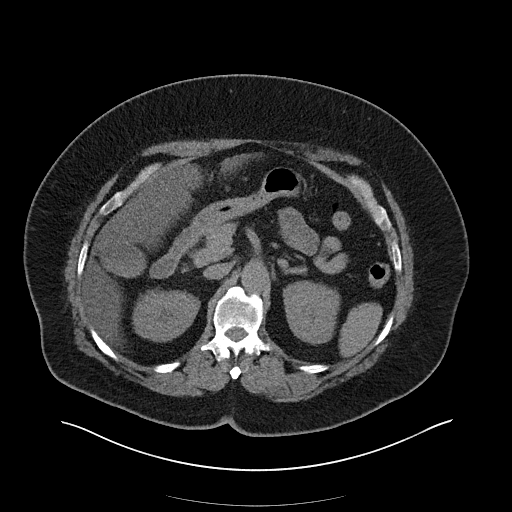
[im 73/110  bone]
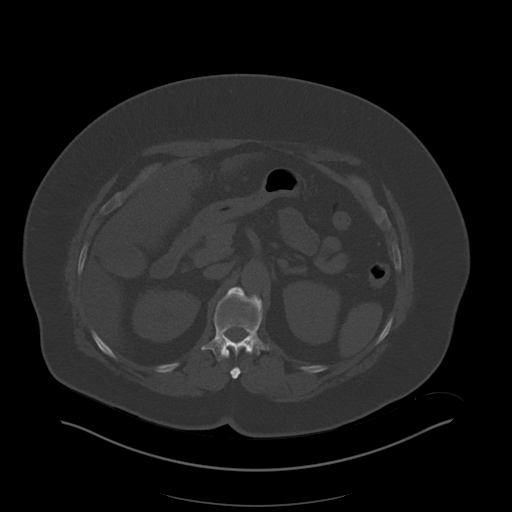
[im 78/110  soft-tissue]
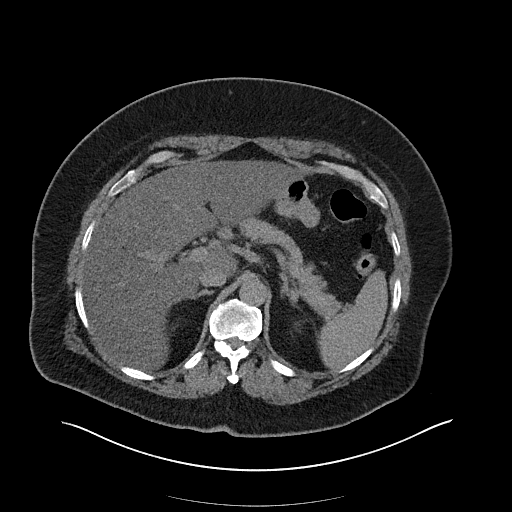
[im 89/110  soft-tissue]
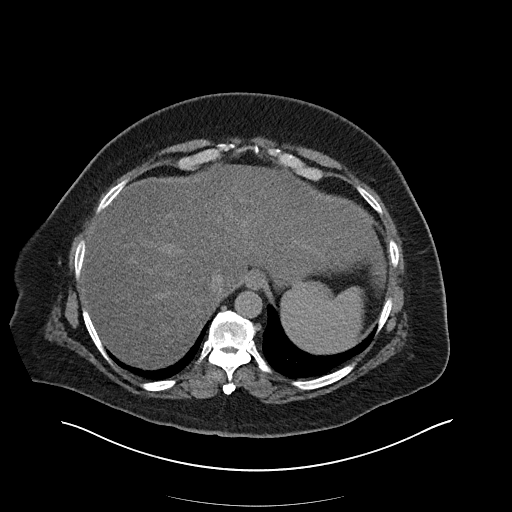
[im 94/110  soft-tissue]
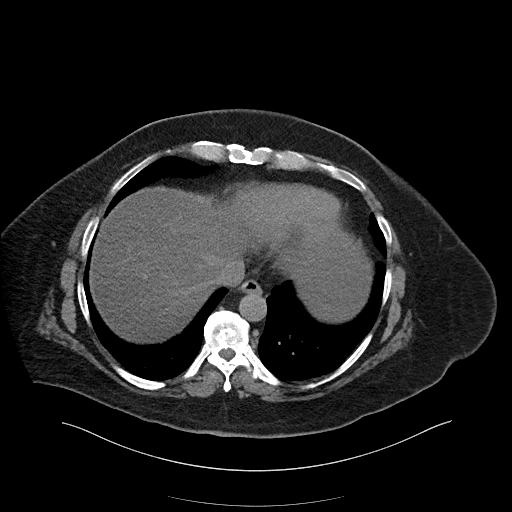
[im 104/110  soft-tissue]
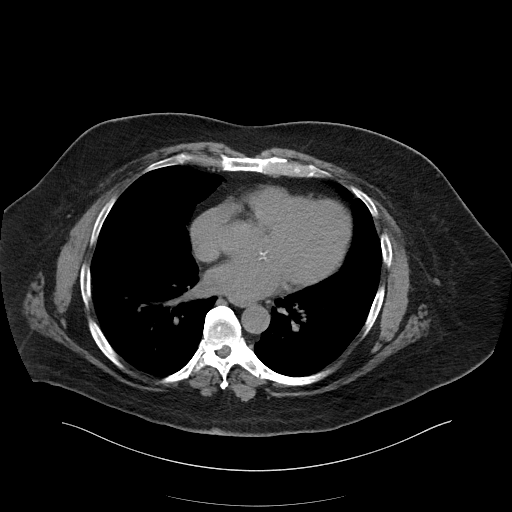

[Series 6: cor st · coronal · 0.86mm/px · 3 of 109 slices shown]
[im 37/109  soft-tissue]
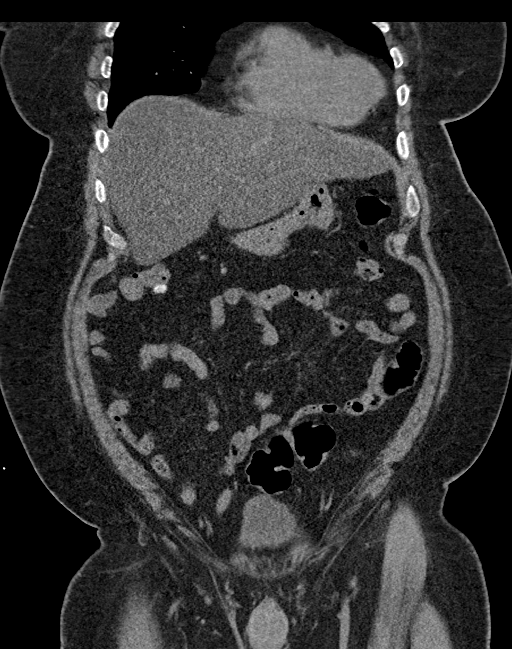
[im 49/109  soft-tissue]
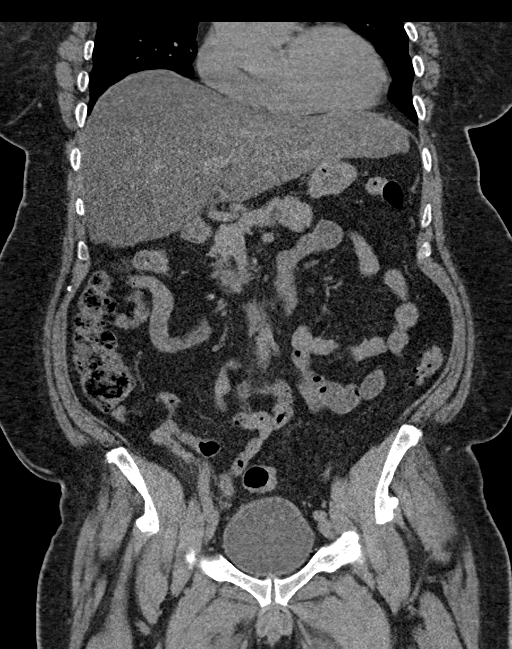
[im 61/109  soft-tissue]
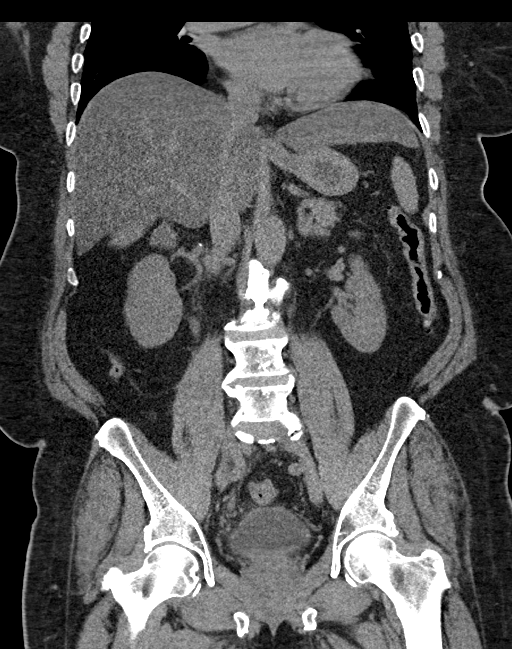

[16 of 46 positions shown; findings below may reference images not displayed]

FINDINGS: Lower chest: No acute abnormality.

Hepatobiliary: There is diffuse fatty infiltration of the liver
parenchyma. No focal liver abnormality is seen. No gallstones,
gallbladder wall thickening, or biliary dilatation.

Pancreas: Unremarkable. No pancreatic ductal dilatation or
surrounding inflammatory changes.

Spleen: Normal in size without focal abnormality.

Adrenals/Urinary Tract: Adrenal glands are unremarkable. Kidneys are
normal in size, without focal lesions. There is mild right-sided
hydronephrosis and hydroureter with mild periureteral and peripelvic
inflammatory fat stranding. No renal calculi are identified. Bladder
is unremarkable.

Stomach/Bowel: Stomach is within normal limits. Appendix appears
normal. No evidence of bowel dilatation. Noninflamed diverticula are
seen throughout the large bowel.

Vascular/Lymphatic: No significant vascular findings are present. No
enlarged abdominal or pelvic lymph nodes.

Reproductive: Prostate is unremarkable.

Other: No abdominal wall hernia or abnormality. No abdominopelvic
ascites.

Musculoskeletal: No acute or significant osseous findings.
IMPRESSION: 1. Findings likely consistent with a recently passed right renal
stone. Sequelae associated with acute pyelonephritis cannot be
excluded.
2. Hepatic steatosis.
3. Colonic diverticulosis.

## 2022-09-05 ENCOUNTER — Other Ambulatory Visit (HOSPITAL_COMMUNITY): Payer: Self-pay

## 2022-09-05 MED ORDER — AMLODIPINE BESYLATE 10 MG PO TABS
10.0000 mg | ORAL_TABLET | Freq: Every day | ORAL | 0 refills | Status: DC
  Filled 2022-09-05: qty 90, 90d supply, fill #0

## 2022-09-05 MED ORDER — LISINOPRIL 40 MG PO TABS
40.0000 mg | ORAL_TABLET | Freq: Every day | ORAL | 0 refills | Status: DC
  Filled 2022-09-05: qty 90, 90d supply, fill #0

## 2022-12-22 ENCOUNTER — Other Ambulatory Visit (HOSPITAL_COMMUNITY): Payer: Self-pay

## 2022-12-22 MED ORDER — LISINOPRIL 40 MG PO TABS
40.0000 mg | ORAL_TABLET | Freq: Every day | ORAL | 0 refills | Status: DC
  Filled 2022-12-22: qty 90, 90d supply, fill #0

## 2022-12-22 MED ORDER — CARVEDILOL 6.25 MG PO TABS
6.2500 mg | ORAL_TABLET | Freq: Two times a day (BID) | ORAL | 0 refills | Status: DC
  Filled 2022-12-22: qty 180, 90d supply, fill #0

## 2022-12-22 MED ORDER — CLONIDINE HCL 0.1 MG PO TABS
0.1000 mg | ORAL_TABLET | Freq: Two times a day (BID) | ORAL | 0 refills | Status: DC
  Filled 2022-12-22: qty 180, 90d supply, fill #0

## 2022-12-22 MED ORDER — AMLODIPINE BESYLATE 10 MG PO TABS
10.0000 mg | ORAL_TABLET | Freq: Every day | ORAL | 0 refills | Status: DC
  Filled 2022-12-22: qty 90, 90d supply, fill #0

## 2022-12-23 ENCOUNTER — Other Ambulatory Visit (HOSPITAL_COMMUNITY): Payer: Self-pay

## 2023-04-03 ENCOUNTER — Other Ambulatory Visit (HOSPITAL_COMMUNITY): Payer: Self-pay

## 2023-04-03 MED ORDER — LISINOPRIL 40 MG PO TABS
40.0000 mg | ORAL_TABLET | Freq: Every day | ORAL | 0 refills | Status: DC
  Filled 2023-04-03: qty 90, 90d supply, fill #0

## 2023-04-03 MED ORDER — AMLODIPINE BESYLATE 10 MG PO TABS
10.0000 mg | ORAL_TABLET | Freq: Every day | ORAL | 0 refills | Status: DC
  Filled 2023-04-03: qty 90, 90d supply, fill #0

## 2023-04-04 ENCOUNTER — Other Ambulatory Visit (HOSPITAL_COMMUNITY): Payer: Self-pay

## 2023-04-08 ENCOUNTER — Emergency Department (HOSPITAL_BASED_OUTPATIENT_CLINIC_OR_DEPARTMENT_OTHER)
Admission: EM | Admit: 2023-04-08 | Discharge: 2023-04-08 | Disposition: A | Discharge status: Home / Self Care | Payer: BLUE CROSS/BLUE SHIELD | Attending: Emergency Medicine | Admitting: Emergency Medicine

## 2023-04-08 ENCOUNTER — Encounter (HOSPITAL_BASED_OUTPATIENT_CLINIC_OR_DEPARTMENT_OTHER): Payer: Self-pay

## 2023-04-08 DIAGNOSIS — M5441 Lumbago with sciatica, right side: Secondary | ICD-10-CM | POA: Diagnosis not present

## 2023-04-08 DIAGNOSIS — M545 Low back pain, unspecified: Secondary | ICD-10-CM | POA: Diagnosis present

## 2023-04-08 MED ORDER — LIDOCAINE 5 % EX PTCH
1.0000 | MEDICATED_PATCH | CUTANEOUS | Status: DC
  Administered 2023-04-08: 1 via TRANSDERMAL
  Filled 2023-04-08: qty 1

## 2023-04-08 MED ORDER — PREDNISONE 10 MG (21) PO TBPK: ORAL_TABLET | Freq: Every day | ORAL | 0 refills | Status: DC

## 2023-04-08 MED ORDER — HYDROCODONE-ACETAMINOPHEN 5-325 MG PO TABS
1.0000 | ORAL_TABLET | Freq: Once | ORAL | Status: AC
  Administered 2023-04-08: 1 via ORAL
  Filled 2023-04-08: qty 1

## 2023-04-08 MED ORDER — LIDOCAINE 5 % EX PTCH: 1.0000 | MEDICATED_PATCH | CUTANEOUS | 0 refills | Status: DC

## 2023-04-08 MED ORDER — DEXAMETHASONE SODIUM PHOSPHATE 10 MG/ML IJ SOLN
10.0000 mg | Freq: Once | INTRAMUSCULAR | Status: AC
  Administered 2023-04-08: 10 mg via INTRAMUSCULAR
  Filled 2023-04-08: qty 1

## 2023-04-08 MED ORDER — OXYCODONE-ACETAMINOPHEN 5-325 MG PO TABS: 1.0000 | ORAL_TABLET | Freq: Four times a day (QID) | ORAL | 0 refills | Status: DC | PRN

## 2023-04-08 NOTE — ED Triage Notes (Addendum)
He reports low back pain radiating into his right leg. He has a hx of sciatica. He is ambulatory and in no distress.

## 2023-04-08 NOTE — ED Notes (Signed)
Pt ambulated in hallway without assistance, reports improvement in back pain after pain meds. Britni PA made aware

## 2023-04-08 NOTE — Discharge Instructions (Signed)
It was a pleasure taking care of you here in the emergency department today  You most likely have sciatica.  [facility] have written you for a few medications to help with your symptoms  One of them is Percocet which is a narcotic prescription.  Please use caution as this medication may make you sleepy.  It does have the addictive potential, only take as needed.  This medication also contains Tylenol do not take any additional containing Tylenol while taking this medication.  Do not drive or operate heavy machinery or make life or death decisions while taking this medicine  I have also written for lidocaine pain patches.  Place 1 to your lower back.  You leave on for 12 hours, remove for 12 hours.  Mostly patchy for 12 hours before placing additional patch.  Your blood pressure was elevated here.  I would recommend continuing your home medications  Return for new or worsening symptoms such as weakness, bowel or bladder incontinence, inability to walk, fever, abdominal pain or chest pain

## 2023-04-08 NOTE — ED Provider Notes (Signed)
EMERGENCY DEPARTMENT AT [facility] Provider Note   CSN: [id number] Arrival date & time: 04/08/23  1226    History  Chief Complaint  Patient presents with   Back Pain    Shawn Lawrence is a 62 y.o. male here for evaluation of lower back pain.  Started to his right lower back goes into his posterior right buttocks.  Started 2-3 days ago. He has history of sciatica, feels similar.  No recent injury or trauma.  No fever, nausea, vomiting, chest pain, shortness of breath, abdominal pain, numbness, weakness, pain or swelling to legs.  No bowel or bladder incontinence, fever, history of IV drug use, saddle paresthesia, hx of malignancy.  He took ibuprofen once which helped however he came here for further evaluation.  He stated last time he needed a "shot" to help his pain.  No recent surgery, immobilization, malignancy, history of PE or DVT.  No claudication symptoms. Denies hip pain, pain, swelling to hips.  HPI     Home Medications Prior to Admission medications   Medication Sig Start Date End Date Taking? Authorizing Provider  lidocaine (LIDODERM) 5 % Place 1 patch onto the skin daily. Remove & Discard patch within 12 hours or as directed by MD 04/08/23  Yes Estle Huguley A, PA-C  oxyCODONE-acetaminophen (PERCOCET/ROXICET) 5-325 MG tablet Take 1 tablet by mouth every 6 (six) hours as needed for severe pain (pain score 7-10). 04/08/23  Yes Neiman Roots A, PA-C  predniSONE (STERAPRED UNI-PAK 21 TAB) 10 MG (21) TBPK tablet Take by mouth daily. Take 6 tabs by mouth daily  for 1 days, then 5 tabs for 1 days, then 4 tabs for 1 days, then 3 tabs for 1 days, 2 tabs for 1 days, then 1 tab by mouth daily for 1 days 04/08/23  Yes Chirstopher Iovino A, PA-C  amLODipine (NORVASC) 10 MG tablet Take 1 tablet (10 mg total) by mouth daily. IM Program. 12/05/18   Tysinger, Kermit Balo, PA-C  amLODipine (NORVASC) 10 MG tablet Take 1 tablet (10 mg total) by mouth daily. 04/03/23      Ascorbic Acid (VITAMIN C) 250 MG CHEW Chew 2 tablets by mouth daily.    [provider]  carvedilol (COREG) 3.125 MG tablet Take 1 tablet (3.125 mg total) by mouth 2 (two) times daily with a meal. Patient not taking: Reported on 05/10/2020 12/05/18   Tysinger, Kermit Balo, PA-C  carvedilol (COREG) 6.25 MG tablet Take 6.25 mg by mouth 2 (two) times daily. 05/04/20   [provider]  carvedilol (COREG) 6.25 MG tablet Take 1 tablet (6.25 mg total) by mouth 2 (two) times daily. 12/22/22     cloNIDine (CATAPRES) 0.1 MG tablet Take 0.1 mg by mouth 2 (two) times daily. 04/21/20   [provider]  cloNIDine (CATAPRES) 0.1 MG tablet TAKE 1 TABLET BY MOUTH TWO TIMES DAILY 09/16/20   Rometta Emery, MD  cloNIDine (CATAPRES) 0.1 MG tablet Take 1 tablet (0.1 mg total) by mouth 2 (two) times daily. 12/22/22     diclofenac Sodium (VOLTAREN) 1 % GEL Apply topically to affected area 4 (four) times daily. 04/08/22     lisinopril (ZESTRIL) 40 MG tablet Take 1 tablet (40 mg total) by mouth daily. 12/05/18   Tysinger, Kermit Balo, PA-C  lisinopril (ZESTRIL) 40 MG tablet Take 1 tablet (40 mg total) by mouth daily. 04/03/23     meloxicam (MOBIC) 7.5 MG tablet TAKE 1 TABLET (7.5 MG TOTAL) BY MOUTH DAILY. Patient not  taking: Reported on 05/10/2020 05/09/18   Tysinger, Kermit Balo, PA-C      Allergies    Hctz [hydrochlorothiazide]    Review of Systems   Review of Systems  Constitutional: Negative.   HENT: Negative.    Respiratory: Negative.    Cardiovascular: Negative.   Gastrointestinal: Negative.   Genitourinary: Negative.   Musculoskeletal:  Positive for back pain. Negative for arthralgias, gait problem, joint swelling, myalgias, neck pain and neck stiffness.  Skin: Negative.   Neurological: Negative.   All other systems reviewed and are negative.   Physical Exam Updated Vital Signs BP (!) 175/114 (BP Location: Right Arm)   Pulse 77   Temp 98.4 F (36.9 C) (Oral)   Resp 17   SpO2 96%   Physical Exam Vitals and nursing note reviewed.  Constitutional:      General: He is not in acute distress.    Appearance: He is well-developed. He is not ill-appearing, toxic-appearing or diaphoretic.  HENT:     Head: Atraumatic.  Eyes:     Pupils: Pupils are equal, round, and reactive to light.  Cardiovascular:     Rate and Rhythm: Normal rate and regular rhythm.     Pulses: Normal pulses.          Radial pulses are 2+ on the right side and 2+ on the left side.       Dorsalis pedis pulses are 2+ on the right side and 2+ on the left side.     Heart sounds: Normal heart sounds.  Pulmonary:     Effort: Pulmonary effort is normal. No respiratory distress.     Breath sounds: Normal breath sounds.  Abdominal:     General: Bowel sounds are normal. There is no distension.     Palpations: Abdomen is soft.     Tenderness: There is no abdominal tenderness. There is left CVA tenderness. There is no right CVA tenderness, guarding or rebound.     Comments: No midline pulsatile abdominal mass, abdomen soft, nontender  Musculoskeletal:        General: Tenderness present. No swelling, deformity or signs of injury. Normal range of motion.     Cervical back: Normal range of motion and neck supple.       Back:     Right lower leg: No edema.     Left lower leg: No edema.     Comments: Tenderness right piriformis, SI region.  No midline back pain.  Tenderness into posterior right gluteal region, no bony tenderness bilateral lower extremities.  2+ DP, PT pulses bilaterally.  Compartments soft.  No erythema or warmth to extremities.  Positive straight leg raise at 45 degrees on right.  Skin:    General: Skin is warm and dry.  Neurological:     General: No focal deficit present.     Mental Status: He is alert and oriented to person, place, and time.     ED Results / Procedures / Treatments   Labs (all labs ordered are listed, but only abnormal results are displayed) Labs Reviewed - No data to  display  EKG None  Radiology No results found.  Procedures Procedures    Medications Ordered in ED Medications  lidocaine (LIDODERM) 5 % 1 patch (1 patch Transdermal Patch Applied 04/08/23 1501)  dexamethasone (DECADRON) injection 10 mg (10 mg Intramuscular Given 04/08/23 1501)  HYDROcodone-acetaminophen (NORCO/VICODIN) 5-325 MG per tablet 1 tablet (1 tablet Oral Given 04/08/23 1501)   ED Course/ Medical Decision Making/ A&P  62 year old here for evaluation of right lower back pain.  Started 3 days ago.  Has history of sciatica states it feels similar.  No recent trauma or injury.  No midline pulsatile abdominal mass.  His compartments are soft, no clinical evidence of VTE.  No history of AAA, dissection.  No abdominal pain, chest pain, numbness, weakness.  He has positive straight leg raise on the right, reproducible pain to his right SI, piriformis region.  No bony tenderness to extremities, hips.  I suspect this is likely sciatica, will treat as such.  Low suspicion for AAA, dissection, ischemia, VTE, claudication, cauda equina, discitis, osteomyelitis, psoas abscess.  Patient reassessed.  Symptoms significantly improved with pain management.  Does have an elevated blood pressure however states he has not taken his medications today.  Low suspicion for hypertensive urgency or emergency.  The patient has been appropriately medically screened and/or stabilized in the ED. I have low suspicion for any other emergent medical condition which would require further screening, evaluation or treatment in the ED or require inpatient management.  Patient is hemodynamically stable and in no acute distress.  Patient able to ambulate in department prior to ED.  Evaluation does not show acute pathology that would require ongoing or additional emergent interventions while in the emergency department or further inpatient treatment.  I have discussed the diagnosis with the patient and answered all  questions.  Pain is been managed while in the emergency department and patient has no further complaints prior to discharge.  Patient is comfortable with plan discussed in room and is stable for discharge at this time.  I have discussed strict return precautions for returning to the emergency department.  Patient was encouraged to follow-up with PCP/specialist refer to at discharge.                                 Medical Decision Making Amount and/or Complexity of Data Reviewed External Data Reviewed: labs, radiology and notes.  Risk OTC drugs. Prescription drug management. Parenteral controlled substances. Decision regarding hospitalization. Diagnosis or treatment significantly limited by social determinants of health.           Final Clinical Impression(s) / ED Diagnoses Final diagnoses:  Acute right-sided low back pain with right-sided sciatica    Rx / DC Orders ED Discharge Orders          Ordered    predniSONE (STERAPRED UNI-PAK 21 TAB) 10 MG (21) TBPK tablet  Daily        04/08/23 1538    lidocaine (LIDODERM) 5 %  Every 24 hours        04/08/23 1538    oxyCODONE-acetaminophen (PERCOCET/ROXICET) 5-325 MG tablet  Every 6 hours PRN        04/08/23 1538              Chamari Cutbirth A, PA-C 04/08/23 1540    Horton, Clabe Seal, DO 04/09/23 0840

## 2023-04-08 NOTE — ED Notes (Signed)
 RN reviewed discharge instructions with pt. Pt verbalized understanding and had no further questions. VSS upon discharge.  

## 2023-08-03 ENCOUNTER — Other Ambulatory Visit (HOSPITAL_COMMUNITY): Payer: Self-pay

## 2023-08-03 MED ORDER — LISINOPRIL 40 MG PO TABS
40.0000 mg | ORAL_TABLET | Freq: Every day | ORAL | 0 refills | Status: DC
  Filled 2023-08-03: qty 90, 90d supply, fill #0

## 2023-08-03 MED ORDER — CLONIDINE HCL 0.1 MG PO TABS
0.1000 mg | ORAL_TABLET | Freq: Two times a day (BID) | ORAL | 0 refills | Status: DC
  Filled 2023-08-03: qty 180, 90d supply, fill #0

## 2023-08-03 MED ORDER — CARVEDILOL 6.25 MG PO TABS
6.2500 mg | ORAL_TABLET | Freq: Two times a day (BID) | ORAL | 0 refills | Status: DC
  Filled 2023-08-03: qty 180, 90d supply, fill #0

## 2023-08-03 MED ORDER — AMLODIPINE BESYLATE 10 MG PO TABS
10.0000 mg | ORAL_TABLET | Freq: Every day | ORAL | 0 refills | Status: DC
  Filled 2023-08-03: qty 90, 90d supply, fill #0

## 2023-08-04 ENCOUNTER — Other Ambulatory Visit (HOSPITAL_COMMUNITY): Payer: Self-pay

## 2023-10-26 ENCOUNTER — Other Ambulatory Visit (HOSPITAL_COMMUNITY): Payer: Self-pay

## 2023-10-26 ENCOUNTER — Other Ambulatory Visit: Payer: Self-pay

## 2023-10-26 ENCOUNTER — Emergency Department (HOSPITAL_BASED_OUTPATIENT_CLINIC_OR_DEPARTMENT_OTHER): Admitting: Radiology

## 2023-10-26 ENCOUNTER — Emergency Department (HOSPITAL_BASED_OUTPATIENT_CLINIC_OR_DEPARTMENT_OTHER)
Admission: EM | Admit: 2023-10-26 | Discharge: 2023-10-26 | Disposition: A | Discharge status: Home / Self Care | Attending: Emergency Medicine | Admitting: Emergency Medicine

## 2023-10-26 ENCOUNTER — Encounter (HOSPITAL_BASED_OUTPATIENT_CLINIC_OR_DEPARTMENT_OTHER): Payer: Self-pay | Admitting: Emergency Medicine

## 2023-10-26 DIAGNOSIS — I10 Essential (primary) hypertension: Secondary | ICD-10-CM | POA: Insufficient documentation

## 2023-10-26 DIAGNOSIS — M5442 Lumbago with sciatica, left side: Secondary | ICD-10-CM | POA: Diagnosis not present

## 2023-10-26 DIAGNOSIS — Z79899 Other long term (current) drug therapy: Secondary | ICD-10-CM | POA: Diagnosis not present

## 2023-10-26 DIAGNOSIS — M545 Low back pain, unspecified: Secondary | ICD-10-CM | POA: Diagnosis present

## 2023-10-26 LAB — URINALYSIS, ROUTINE W REFLEX MICROSCOPIC
Bacteria, UA: NONE SEEN
Bilirubin Urine: NEGATIVE
Glucose, UA: NEGATIVE mg/dL
Ketones, ur: NEGATIVE mg/dL
Leukocytes,Ua: NEGATIVE
Nitrite: NEGATIVE
Protein, ur: 30 mg/dL — AB
Specific Gravity, Urine: 1.016 (ref 1.005–1.030)
pH: 6 (ref 5.0–8.0)

## 2023-10-26 MED ORDER — DEXAMETHASONE SODIUM PHOSPHATE 10 MG/ML IJ SOLN
10.0000 mg | Freq: Once | INTRAMUSCULAR | Status: AC
  Administered 2023-10-26: 10 mg via INTRAMUSCULAR
  Filled 2023-10-26: qty 1

## 2023-10-26 MED ORDER — METHYLPREDNISOLONE 4 MG PO TBPK
ORAL_TABLET | ORAL | 0 refills | Status: DC
  Filled 2023-10-26: qty 21, 6d supply, fill #0

## 2023-10-26 MED ORDER — HYDROCODONE-ACETAMINOPHEN 5-325 MG PO TABS
1.0000 | ORAL_TABLET | Freq: Once | ORAL | Status: AC
  Administered 2023-10-26: 1 via ORAL
  Filled 2023-10-26: qty 1

## 2023-10-26 MED ORDER — OXYCODONE HCL 5 MG PO TABS
2.5000 mg | ORAL_TABLET | Freq: Four times a day (QID) | ORAL | 0 refills | Status: DC | PRN
  Filled 2023-10-26: qty 12, 6d supply, fill #0

## 2023-10-26 MED ORDER — LIDOCAINE 5 % EX PTCH
1.0000 | MEDICATED_PATCH | CUTANEOUS | Status: DC
  Administered 2023-10-26: 1 via TRANSDERMAL
  Filled 2023-10-26: qty 1

## 2023-10-26 NOTE — Discharge Instructions (Addendum)
 As discussed, your urine appeared normal.  No evidence of infection.  The x-ray of your low back did not show obvious fracture or dislocation.  You do have some degenerative changes as well as osteopenic changes.  Will place you on a Medrol Dosepak as well as give you medicine for pain to take as needed for your back pain.  You may continue to take Tylenol  for baseline pain.  Recommend close follow-up with your primary care for reassessment of your symptoms.  Please do not hesitate to return to emergency department if the worrisome signs and symptoms we discussed become apparent.

## 2023-10-26 NOTE — ED Triage Notes (Signed)
 Pt c/o lower back pain and LLE pain x 5 days with dysuria. 1 g tylenol  at 0500 today with no relief

## 2023-10-26 NOTE — ED Provider Notes (Signed)
 [place] EMERGENCY DEPARTMENT AT [facility] Provider Note   CSN: [id number] Arrival date & time: 10/26/23  7829     History  No chief complaint on file.   Dontavian G Hedgepath is a 63 y.o. male.  HPI   63 year old male presents emergency department complaints of back pain.  States that he has been having back pain for the past 4 to 5 days.  Began when he was sleeping and awoke.  States the pain was initially managed with Tylenol  but is since worsened.  Reports some pain rating down his left leg to the level of the knee sometimes down to his toes with certain movements.  States he has history of similar symptoms in the past October of last year but his current symptoms are worse in pain than normal.  Denies any saddle anesthesia, bowel/bladder incontinence, weakness/sensory deficits lower extremities, fever, history of IV drug use, known malignancy.  Patient also states that he has had feelings of burning when urinating.  States that he noticed it once this morning when urinating.  Past medical history significant for hypertension, sciatica, gout, hypertension, obesity, DISH  Home Medications Prior to Admission medications   Medication Sig Start Date End Date Taking? Authorizing Provider  amLODipine  (NORVASC ) 10 MG tablet Take 1 tablet (10 mg total) by mouth daily. IM Program. 12/05/18   Tysinger, Christiane Cowing, PA-C  amLODipine  (NORVASC ) 10 MG tablet Take 1 tablet (10 mg total) by mouth daily. 08/03/23     Ascorbic Acid (VITAMIN C) 250 MG CHEW Chew 2 tablets by mouth daily.    [provider]  carvedilol  (COREG ) 3.125 MG tablet Take 1 tablet (3.125 mg total) by mouth 2 (two) times daily with a meal. Patient not taking: Reported on 05/10/2020 12/05/18   Tysinger, Christiane Cowing, PA-C  carvedilol  (COREG ) 6.25 MG tablet Take 6.25 mg by mouth 2 (two) times daily. 05/04/20   [provider]  carvedilol  (COREG ) 6.25 MG tablet Take 1 tablet (6.25 mg total) by mouth 2 (two)  times daily. 08/03/23     cloNIDine  (CATAPRES ) 0.1 MG tablet Take 0.1 mg by mouth 2 (two) times daily. 04/21/20   [provider]  cloNIDine  (CATAPRES ) 0.1 MG tablet TAKE 1 TABLET BY MOUTH TWO TIMES DAILY 09/16/20   Davida Espy, MD  cloNIDine  (CATAPRES ) 0.1 MG tablet Take 1 tablet (0.1 mg total) by mouth 2 (two) times daily. 08/03/23     diclofenac  Sodium (VOLTAREN ) 1 % GEL Apply topically to affected area 4 (four) times daily. 04/08/22     lidocaine  (LIDODERM ) 5 % Place 1 patch onto the skin daily. Remove & Discard patch within 12 hours or as directed by MD 04/08/23   Henderly, Britni A, PA-C  lisinopril  (ZESTRIL ) 40 MG tablet Take 1 tablet (40 mg total) by mouth daily. 12/05/18   Tysinger, Christiane Cowing, PA-C  lisinopril  (ZESTRIL ) 40 MG tablet Take 1 tablet (40 mg total) by mouth daily. 08/03/23     meloxicam  (MOBIC ) 7.5 MG tablet TAKE 1 TABLET (7.5 MG TOTAL) BY MOUTH DAILY. Patient not taking: Reported on 05/10/2020 05/09/18   Tysinger, Christiane Cowing, PA-C  oxyCODONE -acetaminophen  (PERCOCET/ROXICET) 5-325 MG tablet Take 1 tablet by mouth every 6 (six) hours as needed for severe pain (pain score 7-10). 04/08/23   Henderly, Britni A, PA-C  predniSONE  (STERAPRED UNI-PAK 21 TAB) 10 MG (21) TBPK tablet Take by mouth daily. Take 6 tabs by mouth daily  for 1 days, then 5 tabs for 1 days, then 4  tabs for 1 days, then 3 tabs for 1 days, 2 tabs for 1 days, then 1 tab by mouth daily for 1 days 04/08/23   Henderly, Britni A, PA-C      Allergies    Hctz [hydrochlorothiazide]    Review of Systems   Review of Systems  All other systems reviewed and are negative.   Physical Exam Updated Vital Signs There were no vitals taken for this visit. Physical Exam Vitals and nursing note reviewed.  Constitutional:      General: He is not in acute distress.    Appearance: He is well-developed.  HENT:     Head: Normocephalic and atraumatic.  Eyes:     Conjunctiva/sclera: Conjunctivae normal.  Cardiovascular:      Rate and Rhythm: Normal rate and regular rhythm.     Heart sounds: No murmur heard. Pulmonary:     Effort: Pulmonary effort is normal. No respiratory distress.     Breath sounds: Normal breath sounds.  Abdominal:     Palpations: Abdomen is soft.     Tenderness: There is no abdominal tenderness.  Musculoskeletal:        General: No swelling.     Cervical back: Neck supple.     Comments: No midline tenderness cervical, thoracic spine without step-off deformity.  Paraspinal tenderness in the left lumbar region with extension down to the left buttock.  Straight leg raise positive on the left.  Muscular strength 5 out of 5 lower extremities.  No sensory deficits along major nerve distributions of the lower extremities.  Pedal and posterior tibial pulses 2+ bilaterally.  DTR symmetric at patella as well as Achilles.  Skin:    General: Skin is warm and dry.     Capillary Refill: Capillary refill takes less than 2 seconds.  Neurological:     Mental Status: He is alert.  Psychiatric:        Mood and Affect: Mood normal.     ED Results / Procedures / Treatments   Labs (all labs ordered are listed, but only abnormal results are displayed) Labs Reviewed - No data to display  EKG None  Radiology No results found.  Procedures Procedures    Medications Ordered in ED Medications - No data to display  ED Course/ Medical Decision Making/ A&P                                 Medical Decision Making Amount and/or Complexity of Data Reviewed Labs: ordered. Radiology: ordered.  Risk Prescription drug management.   This patient presents to the ED for concern of back pain, this involves an extensive number of treatment options, and is a complaint that carries with it a high risk of complications and morbidity.  The differential diagnosis includes fracture, strain/sprain, dislocation, ligament/tendon injury, neurovascular compromise, AAA, aortic dissection, spinal epidural abscess, cauda  equina, pyelonephritis, nephrolithiasis, other   Co morbidities that complicate the patient evaluation  See HPI   Additional history obtained:  Additional history obtained from EMR External records from outside source obtained and reviewed including hospital records   Lab Tests:  I Ordered, and personally interpreted labs.  The pertinent results include: UA with small hemoglobin, 30 protein otherwise unremarkable.   Imaging Studies ordered:  I ordered imaging studies including lumbar x-ray I independently visualized and interpreted imaging which showed no acute osseous abnormality.  Osteopenia. I agree with the radiologist interpretation   Cardiac Monitoring: /  EKG:  N/a   Consultations Obtained:  N/a   Problem List / ED Course / Critical interventions / Medication management  Low back pain with left sciatica I ordered medication including Lidoderm , Norco   Reevaluation of the patient after these medicines showed that the patient improved I have reviewed the patients home medicines and have made adjustments as needed   Social Determinants of Health:  Denies tobacco, licit drug use.   Test / Admission - Considered:  Low back pain with left sciatica Vitals signs significant for hypertension blood pressure 151/91. Otherwise within normal range and stable throughout visit. Laboratory/imaging studies significant for: See above 63 year old male presents emergency department complaints of back pain.  States that he has been having back pain for the past 4 to 5 days.  Began when he was sleeping and awoke.  States the pain was initially managed with Tylenol  but is since worsened.  Reports some pain rating down his left leg to the level of the knee sometimes down to his toes with certain movements.  States he has history of similar symptoms in the past October of last year but his current symptoms are worse in pain than normal.  Denies any saddle anesthesia, bowel/bladder  incontinence, weakness/sensory deficits lower extremities, fever, history of IV drug use, known malignancy.  Patient also states that he has had feelings of burning when urinating.  States that he noticed it once this morning when urinating. On exam, paraspinal tenderness in the left lumbar region as well as left buttock as above.  Shaylar raise positive on the left.  Tenderness Szymon HPI/PE; low suspicion for spinal epidural abscess, cauda equina or other spinal cord compression/impingement.  Patient's UA without obvious infection or hematuria; low suspicion for pyelonephritis, nephrolithiasis.  Patient without abdominal pain rating to back, pulse deficits, neurodeficits, significant hypertension; low suspicion for aortic dissection.  Suspect left-sided sciatica as cause of patient's symptoms.  Patient states that he has responded well to steroid taper from prior exacerbations.  Will administer the same.  Patient also expressed interest with physical therapy so we will send a referral to be seen by physical therapy.  Recommend follow-up with PCP in the outpatient setting for reassessment.  Treatment plan discussed with patient and he acknowledged understanding was agreeable to said plan.  Patient overall well-appearing, afebrile in no acute distress. Worrisome signs and symptoms were discussed with the patient, and the patient acknowledged understanding to return to the ED if noticed. Patient was stable upon discharge.          Final Clinical Impression(s) / ED Diagnoses Final diagnoses:  None    Rx / DC Orders ED Discharge Orders     None         [place] Butter, [place] 10/26/23 1238    Afton Horse T, DO 10/27/23 1603

## 2023-12-04 ENCOUNTER — Other Ambulatory Visit (HOSPITAL_COMMUNITY): Payer: Self-pay

## 2023-12-05 ENCOUNTER — Other Ambulatory Visit (HOSPITAL_COMMUNITY): Payer: Self-pay

## 2023-12-05 MED ORDER — LISINOPRIL 40 MG PO TABS
40.0000 mg | ORAL_TABLET | Freq: Every day | ORAL | 0 refills | Status: DC
  Filled 2023-12-05: qty 90, 90d supply, fill #0

## 2023-12-07 ENCOUNTER — Other Ambulatory Visit (HOSPITAL_COMMUNITY): Payer: Self-pay

## 2023-12-08 ENCOUNTER — Other Ambulatory Visit (HOSPITAL_COMMUNITY): Payer: Self-pay

## 2023-12-11 ENCOUNTER — Other Ambulatory Visit (HOSPITAL_COMMUNITY): Payer: Self-pay

## 2023-12-11 MED ORDER — AMLODIPINE BESYLATE 10 MG PO TABS
10.0000 mg | ORAL_TABLET | Freq: Every day | ORAL | 0 refills | Status: DC
  Filled 2023-12-11: qty 90, 90d supply, fill #0

## 2023-12-11 MED ORDER — TRAZODONE HCL 50 MG PO TABS
50.0000 mg | ORAL_TABLET | Freq: Every evening | ORAL | 0 refills | Status: DC
  Filled 2023-12-11: qty 30, 30d supply, fill #0

## 2023-12-12 ENCOUNTER — Other Ambulatory Visit (HOSPITAL_COMMUNITY): Payer: Self-pay

## 2023-12-12 MED ORDER — METFORMIN HCL 500 MG PO TABS
500.0000 mg | ORAL_TABLET | Freq: Two times a day (BID) | ORAL | 0 refills | Status: DC
  Filled 2023-12-12: qty 180, 90d supply, fill #0

## 2024-03-19 ENCOUNTER — Other Ambulatory Visit (HOSPITAL_COMMUNITY): Payer: Self-pay

## 2024-03-19 MED ORDER — LISINOPRIL 40 MG PO TABS
40.0000 mg | ORAL_TABLET | Freq: Every day | ORAL | 0 refills | Status: AC
  Filled 2024-03-19: qty 90, 90d supply, fill #0

## 2024-03-19 MED ORDER — CLONIDINE HCL 0.1 MG PO TABS
0.1000 mg | ORAL_TABLET | Freq: Two times a day (BID) | ORAL | 0 refills | Status: AC
  Filled 2024-03-19: qty 180, 90d supply, fill #0

## 2024-03-19 MED ORDER — CARVEDILOL 6.25 MG PO TABS
6.2500 mg | ORAL_TABLET | Freq: Two times a day (BID) | ORAL | 0 refills | Status: AC
  Filled 2024-03-19: qty 180, 90d supply, fill #0

## 2024-03-19 MED ORDER — AMLODIPINE BESYLATE 10 MG PO TABS
10.0000 mg | ORAL_TABLET | Freq: Every day | ORAL | 0 refills | Status: AC
  Filled 2024-03-19: qty 90, 90d supply, fill #0

## 2024-03-20 ENCOUNTER — Other Ambulatory Visit (HOSPITAL_COMMUNITY): Payer: Self-pay

## 2024-03-21 ENCOUNTER — Other Ambulatory Visit: Payer: Self-pay

## 2024-03-21 ENCOUNTER — Other Ambulatory Visit (HOSPITAL_COMMUNITY): Payer: Self-pay

## 2024-05-04 ENCOUNTER — Emergency Department (HOSPITAL_COMMUNITY)

## 2024-05-04 ENCOUNTER — Inpatient Hospital Stay (HOSPITAL_COMMUNITY)
Admission: EM | Admit: 2024-05-04 | Discharge: 2024-05-09 | DRG: 871 | Disposition: A | Discharge status: Home / Self Care | Attending: Student | Admitting: Student

## 2024-05-04 ENCOUNTER — Encounter (HOSPITAL_COMMUNITY): Payer: Self-pay

## 2024-05-04 ENCOUNTER — Other Ambulatory Visit: Payer: Self-pay

## 2024-05-04 DIAGNOSIS — Z79899 Other long term (current) drug therapy: Secondary | ICD-10-CM

## 2024-05-04 DIAGNOSIS — E861 Hypovolemia: Secondary | ICD-10-CM | POA: Diagnosis present

## 2024-05-04 DIAGNOSIS — M4810 Ankylosing hyperostosis [Forestier], site unspecified: Secondary | ICD-10-CM

## 2024-05-04 DIAGNOSIS — R079 Chest pain, unspecified: Secondary | ICD-10-CM | POA: Diagnosis not present

## 2024-05-04 DIAGNOSIS — J189 Pneumonia, unspecified organism: Principal | ICD-10-CM | POA: Diagnosis present

## 2024-05-04 DIAGNOSIS — K760 Fatty (change of) liver, not elsewhere classified: Secondary | ICD-10-CM | POA: Diagnosis present

## 2024-05-04 DIAGNOSIS — A419 Sepsis, unspecified organism: Secondary | ICD-10-CM | POA: Diagnosis not present

## 2024-05-04 DIAGNOSIS — D62 Acute posthemorrhagic anemia: Secondary | ICD-10-CM | POA: Diagnosis not present

## 2024-05-04 DIAGNOSIS — M545 Low back pain, unspecified: Secondary | ICD-10-CM

## 2024-05-04 DIAGNOSIS — Z8249 Family history of ischemic heart disease and other diseases of the circulatory system: Secondary | ICD-10-CM | POA: Diagnosis not present

## 2024-05-04 DIAGNOSIS — Z7984 Long term (current) use of oral hypoglycemic drugs: Secondary | ICD-10-CM

## 2024-05-04 DIAGNOSIS — J159 Unspecified bacterial pneumonia: Secondary | ICD-10-CM | POA: Diagnosis present

## 2024-05-04 DIAGNOSIS — E785 Hyperlipidemia, unspecified: Secondary | ICD-10-CM | POA: Diagnosis present

## 2024-05-04 DIAGNOSIS — Z833 Family history of diabetes mellitus: Secondary | ICD-10-CM

## 2024-05-04 DIAGNOSIS — Z1152 Encounter for screening for COVID-19: Secondary | ICD-10-CM | POA: Diagnosis not present

## 2024-05-04 DIAGNOSIS — R6521 Severe sepsis with septic shock: Secondary | ICD-10-CM | POA: Diagnosis present

## 2024-05-04 DIAGNOSIS — K2950 Unspecified chronic gastritis without bleeding: Secondary | ICD-10-CM | POA: Diagnosis not present

## 2024-05-04 DIAGNOSIS — E66813 Obesity, class 3: Secondary | ICD-10-CM | POA: Diagnosis present

## 2024-05-04 DIAGNOSIS — G4733 Obstructive sleep apnea (adult) (pediatric): Secondary | ICD-10-CM | POA: Diagnosis present

## 2024-05-04 DIAGNOSIS — I7121 Aneurysm of the ascending aorta, without rupture: Secondary | ICD-10-CM | POA: Diagnosis present

## 2024-05-04 DIAGNOSIS — K921 Melena: Secondary | ICD-10-CM | POA: Diagnosis not present

## 2024-05-04 DIAGNOSIS — Z6838 Body mass index (BMI) 38.0-38.9, adult: Secondary | ICD-10-CM | POA: Diagnosis not present

## 2024-05-04 DIAGNOSIS — Z888 Allergy status to other drugs, medicaments and biological substances status: Secondary | ICD-10-CM

## 2024-05-04 DIAGNOSIS — E872 Acidosis, unspecified: Secondary | ICD-10-CM | POA: Diagnosis present

## 2024-05-04 DIAGNOSIS — E66812 Obesity, class 2: Secondary | ICD-10-CM | POA: Diagnosis not present

## 2024-05-04 DIAGNOSIS — M109 Gout, unspecified: Secondary | ICD-10-CM | POA: Diagnosis present

## 2024-05-04 DIAGNOSIS — E1165 Type 2 diabetes mellitus with hyperglycemia: Secondary | ICD-10-CM | POA: Diagnosis present

## 2024-05-04 DIAGNOSIS — E119 Type 2 diabetes mellitus without complications: Secondary | ICD-10-CM | POA: Diagnosis not present

## 2024-05-04 DIAGNOSIS — G4730 Sleep apnea, unspecified: Secondary | ICD-10-CM | POA: Diagnosis not present

## 2024-05-04 DIAGNOSIS — I10 Essential (primary) hypertension: Secondary | ICD-10-CM | POA: Diagnosis present

## 2024-05-04 DIAGNOSIS — R61 Generalized hyperhidrosis: Secondary | ICD-10-CM | POA: Diagnosis present

## 2024-05-04 DIAGNOSIS — K264 Chronic or unspecified duodenal ulcer with hemorrhage: Secondary | ICD-10-CM | POA: Diagnosis not present

## 2024-05-04 DIAGNOSIS — K269 Duodenal ulcer, unspecified as acute or chronic, without hemorrhage or perforation: Secondary | ICD-10-CM

## 2024-05-04 DIAGNOSIS — Z713 Dietary counseling and surveillance: Secondary | ICD-10-CM

## 2024-05-04 DIAGNOSIS — G8929 Other chronic pain: Secondary | ICD-10-CM | POA: Diagnosis present

## 2024-05-04 LAB — CBC WITH DIFFERENTIAL/PLATELET
Basophils Absolute: 0.4 K/uL — ABNORMAL HIGH (ref 0.0–0.1)
Basophils Relative: 2 %
Eosinophils Absolute: 0.2 K/uL (ref 0.0–0.5)
Eosinophils Relative: 1 %
HCT: 38.5 % — ABNORMAL LOW (ref 39.0–52.0)
Hemoglobin: 12.7 g/dL — ABNORMAL LOW (ref 13.0–17.0)
Lymphocytes Relative: 55 %
Lymphs Abs: 10.7 K/uL — ABNORMAL HIGH (ref 0.7–4.0)
MCH: 28.7 pg (ref 26.0–34.0)
MCHC: 33 g/dL (ref 30.0–36.0)
MCV: 87.1 fL (ref 80.0–100.0)
Monocytes Absolute: 0.8 K/uL (ref 0.1–1.0)
Monocytes Relative: 4 %
Neutro Abs: 7.4 K/uL (ref 1.7–7.7)
Neutrophils Relative %: 38 %
Platelets: 348 K/uL (ref 150–400)
RBC: 4.42 MIL/uL (ref 4.22–5.81)
RDW: 13.7 % (ref 11.5–15.5)
Smear Review: NORMAL
WBC: 19.4 K/uL — ABNORMAL HIGH (ref 4.0–10.5)
nRBC: 0 % (ref 0.0–0.2)

## 2024-05-04 LAB — I-STAT CHEM 8, ED
BUN: 31 mg/dL — ABNORMAL HIGH (ref 8–23)
Calcium, Ion: 0.81 mmol/L — CL (ref 1.15–1.40)
Chloride: 113 mmol/L — ABNORMAL HIGH (ref 98–111)
Creatinine, Ser: 0.8 mg/dL (ref 0.61–1.24)
Glucose, Bld: 264 mg/dL — ABNORMAL HIGH (ref 70–99)
HCT: 37 % — ABNORMAL LOW (ref 39.0–52.0)
Hemoglobin: 12.6 g/dL — ABNORMAL LOW (ref 13.0–17.0)
Potassium: 4.2 mmol/L (ref 3.5–5.1)
Sodium: 139 mmol/L (ref 135–145)
TCO2: 19 mmol/L — ABNORMAL LOW (ref 22–32)

## 2024-05-04 LAB — I-STAT CG4 LACTIC ACID, ED: Lactic Acid, Venous: 2.8 mmol/L (ref 0.5–1.9)

## 2024-05-04 LAB — URINALYSIS, ROUTINE W REFLEX MICROSCOPIC
Bacteria, UA: NONE SEEN
Bilirubin Urine: NEGATIVE
Glucose, UA: NEGATIVE mg/dL
Hgb urine dipstick: NEGATIVE
Ketones, ur: NEGATIVE mg/dL
Leukocytes,Ua: NEGATIVE
Nitrite: NEGATIVE
Protein, ur: NEGATIVE mg/dL
Specific Gravity, Urine: 1.042 — ABNORMAL HIGH (ref 1.005–1.030)
pH: 7 (ref 5.0–8.0)

## 2024-05-04 LAB — RESPIRATORY PANEL BY PCR

## 2024-05-04 LAB — HEMOGLOBIN A1C
Hgb A1c MFr Bld: 7.5 % — ABNORMAL HIGH (ref 4.8–5.6)
Mean Plasma Glucose: 168.55 mg/dL

## 2024-05-04 LAB — RAPID URINE DRUG SCREEN, HOSP PERFORMED
Amphetamines: NOT DETECTED
Barbiturates: NOT DETECTED
Benzodiazepines: NOT DETECTED
Cocaine: NOT DETECTED
Opiates: NOT DETECTED
Tetrahydrocannabinol: NOT DETECTED

## 2024-05-04 LAB — MAGNESIUM: Magnesium: 1.9 mg/dL (ref 1.7–2.4)

## 2024-05-04 LAB — HEPATIC FUNCTION PANEL
ALT: 17 U/L (ref 0–44)
AST: 24 U/L (ref 15–41)
Albumin: 2.7 g/dL — ABNORMAL LOW (ref 3.5–5.0)
Alkaline Phosphatase: 65 U/L (ref 38–126)
Bilirubin, Direct: 0.2 mg/dL (ref 0.0–0.2)
Indirect Bilirubin: 0.7 mg/dL (ref 0.3–0.9)
Total Bilirubin: 0.9 mg/dL (ref 0.0–1.2)
Total Protein: 7.3 g/dL (ref 6.5–8.1)

## 2024-05-04 LAB — TSH: TSH: 0.339 u[IU]/mL — ABNORMAL LOW (ref 0.350–4.500)

## 2024-05-04 LAB — BASIC METABOLIC PANEL WITH GFR
Anion gap: 5 (ref 5–15)
BUN: 27 mg/dL — ABNORMAL HIGH (ref 8–23)
CO2: 25 mmol/L (ref 22–32)
Calcium: 8.6 mg/dL — ABNORMAL LOW (ref 8.9–10.3)
Chloride: 108 mmol/L (ref 98–111)
Creatinine, Ser: 0.95 mg/dL (ref 0.61–1.24)
GFR, Estimated: >60 mL/min (ref >60)
Glucose, Bld: 234 mg/dL — ABNORMAL HIGH (ref 70–99)
Potassium: 4.3 mmol/L (ref 3.5–5.1)
Sodium: 138 mmol/L (ref 135–145)

## 2024-05-04 LAB — TROPONIN I (HIGH SENSITIVITY)
Troponin I (High Sensitivity): 6 ng/L (ref <18)
Troponin I (High Sensitivity): 7 ng/L (ref <18)

## 2024-05-04 LAB — SARS CORONAVIRUS 2 BY RT PCR: SARS Coronavirus 2 by RT PCR: NEGATIVE

## 2024-05-04 LAB — GLUCOSE, CAPILLARY: Glucose-Capillary: 187 mg/dL — ABNORMAL HIGH (ref 70–99)

## 2024-05-04 LAB — CBG MONITORING, ED: Glucose-Capillary: 254 mg/dL — ABNORMAL HIGH (ref 70–99)

## 2024-05-04 LAB — ETHANOL: Alcohol, Ethyl (B): <15 mg/dL (ref <15)

## 2024-05-04 LAB — LIPASE, BLOOD: Lipase: 31 U/L (ref 11–51)

## 2024-05-04 LAB — BRAIN NATRIURETIC PEPTIDE: B Natriuretic Peptide: 2.3 pg/mL (ref 0.0–100.0)

## 2024-05-04 MED ORDER — NOREPINEPHRINE 4 MG/250ML-% IV SOLN
0.0000 ug/min | INTRAVENOUS | Status: DC
  Administered 2024-05-04: 10 ug/min via INTRAVENOUS
  Filled 2024-05-04: qty 250

## 2024-05-04 MED ORDER — LIDOCAINE 5 % EX PTCH
1.0000 | MEDICATED_PATCH | CUTANEOUS | Status: DC
  Administered 2024-05-04 – 2024-05-06 (×2): 1 via TRANSDERMAL
  Filled 2024-05-04 (×4): qty 1

## 2024-05-04 MED ORDER — ACETAMINOPHEN 325 MG PO TABS: 650.0000 mg | ORAL_TABLET | Freq: Four times a day (QID) | ORAL | Status: DC | PRN

## 2024-05-04 MED ORDER — INSULIN ASPART 100 UNIT/ML IJ SOLN
0.0000 [IU] | Freq: Three times a day (TID) | INTRAMUSCULAR | Status: DC
  Administered 2024-05-05 (×3): 2 [IU] via SUBCUTANEOUS
  Filled 2024-05-04 (×3): qty 2

## 2024-05-04 MED ORDER — CALCIUM GLUCONATE-NACL 1-0.675 GM/50ML-% IV SOLN
1.0000 g | Freq: Once | INTRAVENOUS | Status: AC
  Administered 2024-05-04: 1000 mg via INTRAVENOUS
  Filled 2024-05-04: qty 50

## 2024-05-04 MED ORDER — SODIUM CHLORIDE 0.9 % IV SOLN: INTRAVENOUS | Status: AC

## 2024-05-04 MED ORDER — ONDANSETRON HCL 4 MG/2ML IJ SOLN: 4.0000 mg | Freq: Four times a day (QID) | INTRAMUSCULAR | Status: DC | PRN

## 2024-05-04 MED ORDER — SENNOSIDES-DOCUSATE SODIUM 8.6-50 MG PO TABS: 1.0000 | ORAL_TABLET | Freq: Every evening | ORAL | Status: DC | PRN

## 2024-05-04 MED ORDER — ENOXAPARIN SODIUM 60 MG/0.6ML IJ SOSY
60.0000 mg | PREFILLED_SYRINGE | INTRAMUSCULAR | Status: DC
  Administered 2024-05-04: 60 mg via SUBCUTANEOUS
  Filled 2024-05-04 (×2): qty 0.6

## 2024-05-04 MED ORDER — CEFTRIAXONE SODIUM 1 G IJ SOLR
1.0000 g | INTRAMUSCULAR | Status: AC
  Administered 2024-05-05 – 2024-05-08 (×4): 1 g via INTRAVENOUS
  Filled 2024-05-04 (×4): qty 10

## 2024-05-04 MED ORDER — LACTATED RINGERS IV BOLUS
1000.0000 mL | Freq: Once | INTRAVENOUS | Status: AC
  Administered 2024-05-04: 1000 mL via INTRAVENOUS

## 2024-05-04 MED ORDER — LACTATED RINGERS IV BOLUS
500.0000 mL | Freq: Once | INTRAVENOUS | Status: AC
  Administered 2024-05-04: 500 mL via INTRAVENOUS

## 2024-05-04 MED ORDER — ACETAMINOPHEN 500 MG PO TABS
1000.0000 mg | ORAL_TABLET | Freq: Three times a day (TID) | ORAL | Status: DC
  Administered 2024-05-04 – 2024-05-09 (×11): 1000 mg via ORAL
  Filled 2024-05-04 (×12): qty 2

## 2024-05-04 MED ORDER — SODIUM CHLORIDE 0.9 % IV SOLN
1.0000 g | Freq: Once | INTRAVENOUS | Status: AC
  Administered 2024-05-04: 1 g via INTRAVENOUS
  Filled 2024-05-04: qty 10

## 2024-05-04 MED ORDER — ACETAMINOPHEN 650 MG RE SUPP: 650.0000 mg | Freq: Four times a day (QID) | RECTAL | Status: DC | PRN

## 2024-05-04 MED ORDER — INSULIN ASPART 100 UNIT/ML IJ SOLN: 0.0000 [IU] | Freq: Every day | INTRAMUSCULAR | Status: DC

## 2024-05-04 MED ORDER — ONDANSETRON HCL 4 MG/2ML IJ SOLN
4.0000 mg | Freq: Once | INTRAMUSCULAR | Status: AC
  Administered 2024-05-04: 4 mg via INTRAVENOUS
  Filled 2024-05-04: qty 2

## 2024-05-04 MED ORDER — IOHEXOL 350 MG/ML SOLN
100.0000 mL | Freq: Once | INTRAVENOUS | Status: AC | PRN
  Administered 2024-05-04: 100 mL via INTRAVENOUS

## 2024-05-04 MED ORDER — AZITHROMYCIN 250 MG PO TABS
500.0000 mg | ORAL_TABLET | Freq: Every day | ORAL | Status: AC
  Administered 2024-05-05 – 2024-05-08 (×4): 500 mg via ORAL
  Filled 2024-05-04 (×4): qty 2

## 2024-05-04 MED ORDER — SODIUM CHLORIDE 0.9 % IV SOLN
500.0000 mg | Freq: Once | INTRAVENOUS | Status: AC
  Administered 2024-05-04: 500 mg via INTRAVENOUS
  Filled 2024-05-04: qty 5

## 2024-05-04 MED ORDER — FENTANYL CITRATE (PF) 50 MCG/ML IJ SOSY
50.0000 ug | PREFILLED_SYRINGE | Freq: Once | INTRAMUSCULAR | Status: AC
  Administered 2024-05-04: 50 ug via INTRAVENOUS
  Filled 2024-05-04: qty 1

## 2024-05-04 MED ORDER — ONDANSETRON HCL 4 MG PO TABS: 4.0000 mg | ORAL_TABLET | Freq: Four times a day (QID) | ORAL | Status: DC | PRN

## 2024-05-04 MED ORDER — BISACODYL 5 MG PO TBEC: 5.0000 mg | DELAYED_RELEASE_TABLET | Freq: Every day | ORAL | Status: DC | PRN

## 2024-05-04 NOTE — ED Provider Notes (Signed)
 [place] EMERGENCY DEPARTMENT AT [facility] Provider Note   CSN: [id number] Arrival date & time: 05/04/24  1325     Patient presents with: Generalized Weakness and Diaphoresis   Shawn Lawrence is a 63 y.o. male.   HPI Patient presents for generalized weakness.  Medical history includes HTN, gout.  For the past 3 days, he has had generalized weakness.  He does endorse some recent nausea and vomiting.  Last episode of emesis was yesterday.  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  While there, he had worsened generalized weakness.  EMS was called.  EMS noted diaphoresis on scene and during transit.  Patient's vital signs were normal.  CBG was in the range of 250.  Patient denied any current or recent areas of pain.  On arrival, he endorses current nausea, ongoing generalized weakness, and pain in his head and back.    Prior to Admission medications   Medication Sig Start Date End Date Taking? Authorizing Provider  amLODipine  (NORVASC ) 10 MG tablet Take 1 tablet (10 mg total) by mouth daily. IM Program. 12/05/18   Tysinger, Alm RAMAN, PA-C  amLODipine  (NORVASC ) 10 MG tablet Take 1 tablet (10 mg total) by mouth daily. 08/03/23     amLODipine  (NORVASC ) 10 MG tablet Take 1 tablet (10 mg total) by mouth daily. 03/19/24     Ascorbic Acid  (VITAMIN C) 250 MG CHEW Chew 2 tablets by mouth daily.    [provider]  carvedilol  (COREG ) 3.125 MG tablet Take 1 tablet (3.125 mg total) by mouth 2 (two) times daily with a meal. Patient not taking: Reported on 05/10/2020 12/05/18   Tysinger, Alm RAMAN, PA-C  carvedilol  (COREG ) 6.25 MG tablet Take 6.25 mg by mouth 2 (two) times daily. 05/04/20   [provider]  carvedilol  (COREG ) 6.25 MG tablet Take 1 tablet (6.25 mg total) by mouth 2 (two) times daily. 03/19/24     cloNIDine  (CATAPRES ) 0.1 MG tablet Take 0.1 mg by mouth 2 (two) times daily. 04/21/20   [provider]  cloNIDine  (CATAPRES ) 0.1 MG tablet TAKE 1  TABLET BY MOUTH TWO TIMES DAILY 09/16/20   Sim Emery CROME, MD  cloNIDine  (CATAPRES ) 0.1 MG tablet Take 1 tablet (0.1 mg total) by mouth 2 (two) times daily. 03/19/24     diclofenac  Sodium (VOLTAREN ) 1 % GEL Apply topically to affected area 4 (four) times daily. 04/08/22     lidocaine  (LIDODERM ) 5 % Place 1 patch onto the skin daily. Remove & Discard patch within 12 hours or as directed by MD 04/08/23   Henderly, Britni A, PA-C  lisinopril  (ZESTRIL ) 40 MG tablet Take 1 tablet (40 mg total) by mouth daily. 12/05/18   Tysinger, Alm RAMAN, PA-C  lisinopril  (ZESTRIL ) 40 MG tablet Take 1 tablet (40 mg total) by mouth daily. 03/19/24     meloxicam  (MOBIC ) 7.5 MG tablet TAKE 1 TABLET (7.5 MG TOTAL) BY MOUTH DAILY. Patient not taking: Reported on 05/10/2020 05/09/18   Tysinger, Alm RAMAN, PA-C  metFORMIN  (GLUCOPHAGE ) 500 MG tablet Take 1 tablet (500 mg total) by mouth 2 (two) times daily. 12/12/23   Sim Emery CROME, MD  methylPREDNISolone  (MEDROL  DOSEPAK) 4 MG TBPK tablet See pack instructions 10/26/23   Silver Fell A, PA  oxyCODONE  (ROXICODONE ) 5 MG immediate release tablet Take 1/2 tablet (2.5 mg total) by mouth every 6 (six) hours as needed for severe pain (pain score 7-10). 10/26/23   Silver Fell LABOR, PA  traZODone  (DESYREL ) 50  MG tablet Take 1 tablet (50 mg total) by mouth at bedtime. 12/11/23   Sim Emery CROME, MD    Allergies: Hctz [hydrochlorothiazide]    Review of Systems  Constitutional:  Positive for diaphoresis.  Gastrointestinal:  Positive for nausea and vomiting.  Musculoskeletal:  Positive for back pain.  Neurological:  Positive for weakness (Generalized) and headaches.  All other systems reviewed and are negative.   Updated Vital Signs BP 132/85   Pulse 93   Temp (!) 97.4 F (36.3 C) (Oral)   Resp 17   Ht 6' (1.829 m)   Wt 127 kg   SpO2 99%   BMI 37.97 kg/m   Physical Exam Vitals and nursing note reviewed.  Constitutional:      General: He is not in acute distress.     Appearance: Normal appearance. He is well-developed. He is diaphoretic. He is not ill-appearing or toxic-appearing.  HENT:     Head: Normocephalic and atraumatic.     Right Ear: External ear normal.     Left Ear: External ear normal.     Nose: Nose normal.     Mouth/Throat:     Mouth: Mucous membranes are moist.  Eyes:     Extraocular Movements: Extraocular movements intact.     Conjunctiva/sclera: Conjunctivae normal.  Cardiovascular:     Rate and Rhythm: Normal rate and regular rhythm.  Pulmonary:     Effort: Pulmonary effort is normal. No respiratory distress.  Abdominal:     General: There is no distension.     Palpations: Abdomen is soft.     Tenderness: There is no abdominal tenderness.  Musculoskeletal:        General: No swelling. Normal range of motion.     Cervical back: Normal range of motion and neck supple.     Right lower leg: No edema.     Left lower leg: No edema.  Skin:    General: Skin is warm.     Capillary Refill: Capillary refill takes less than 2 seconds.     Coloration: Skin is not jaundiced or pale.  Neurological:     General: No focal deficit present.     Mental Status: He is alert and oriented to person, place, and time.     Cranial Nerves: No cranial nerve deficit.     Sensory: No sensory deficit.     Motor: No weakness.     Coordination: Coordination normal.  Psychiatric:        Mood and Affect: Mood normal.        Behavior: Behavior normal.     (all labs ordered are listed, but only abnormal results are displayed) Labs Reviewed  BASIC METABOLIC PANEL WITH GFR - Abnormal; Notable for the following components:      Result Value   Glucose, Bld 234 (*)    BUN 27 (*)    Calcium  8.6 (*)    All other components within normal limits  HEPATIC FUNCTION PANEL - Abnormal; Notable for the following components:   Albumin 2.7 (*)    All other components within normal limits  CBC WITH DIFFERENTIAL/PLATELET - Abnormal; Notable for the following  components:   WBC 19.4 (*)    Hemoglobin 12.7 (*)    HCT 38.5 (*)    Lymphs Abs 10.7 (*)    Basophils Absolute 0.4 (*)    All other components within normal limits  URINALYSIS, ROUTINE W REFLEX MICROSCOPIC - Abnormal; Notable for the following components:   Specific Gravity,  Urine 1.042 (*)    All other components within normal limits  CBG MONITORING, ED - Abnormal; Notable for the following components:   Glucose-Capillary 254 (*)    All other components within normal limits  I-STAT CHEM 8, ED - Abnormal; Notable for the following components:   Chloride 113 (*)    BUN 31 (*)    Glucose, Bld 264 (*)    Calcium , Ion 0.81 (*)    TCO2 19 (*)    Hemoglobin 12.6 (*)    HCT 37.0 (*)    All other components within normal limits  I-STAT CG4 LACTIC ACID, ED - Abnormal; Notable for the following components:   Lactic Acid, Venous 2.8 (*)    All other components within normal limits  CULTURE, BLOOD (ROUTINE X 2)  CULTURE, BLOOD (ROUTINE X 2)  MAGNESIUM  LIPASE, BLOOD  BRAIN NATRIURETIC PEPTIDE  RAPID URINE DRUG SCREEN, HOSP PERFORMED  ETHANOL  TSH  PATHOLOGIST SMEAR REVIEW  I-STAT CG4 LACTIC ACID, ED  TROPONIN I (HIGH SENSITIVITY)  TROPONIN I (HIGH SENSITIVITY)    EKG: None  Radiology: CT Head Wo Contrast Result Date: 05/04/2024 CLINICAL DATA:  New onset headache EXAM: CT HEAD WITHOUT CONTRAST TECHNIQUE: Contiguous axial images were obtained from the base of the skull through the vertex without intravenous contrast. RADIATION DOSE REDUCTION: This exam was performed according to the departmental dose-optimization program which includes automated exposure control, adjustment of the mA and/or kV according to patient size and/or use of iterative reconstruction technique. COMPARISON:  03/02/2018 FINDINGS: Brain: No acute infarct or hemorrhage. Lateral ventricles and midline structures are stable, with a 6 mm colloid cyst again noted. No acute extra-axial fluid collections. No mass effect.  Vascular: No hyperdense vessel or unexpected calcification. Skull: Normal. Negative for fracture or focal lesion. Sinuses/Orbits: No acute finding. Other: None. IMPRESSION: 1. No acute intracranial process. 2. 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 Electronically Signed   By: Ozell Daring M.D.   On: 05/04/2024 15:12   CT Angio Chest/Abd/Pel for Dissection W and/or Wo Contrast Result Date: 05/04/2024 CLINICAL DATA:  Acute aortic syndrome, weakness, headache EXAM: CT ANGIOGRAPHY CHEST, ABDOMEN AND PELVIS TECHNIQUE: Non-contrast CT of the chest was initially obtained. Multidetector CT imaging through the chest, abdomen and pelvis was performed using the standard protocol during bolus administration of intravenous contrast. Multiplanar reconstructed images and MIPs were obtained and reviewed to evaluate the vascular anatomy. RADIATION DOSE REDUCTION: This exam was performed according to the departmental dose-optimization program which includes automated exposure control, adjustment of the mA and/or kV according to patient size and/or use of iterative reconstruction technique. CONTRAST:  OMNIPAQUE  IOHEXOL  350 MG/ML SOLN COMPARISON:  05/10/2020, 05/04/2024 FINDINGS: CTA CHEST FINDINGS Cardiovascular: There is a 4 cm ascending thoracic aortic aneurysm. No evidence of thoracic aortic dissection. The heart is unremarkable without pericardial effusion. Prominent atherosclerosis within the LAD distribution of the coronary vasculature. There is technically adequate opacification of the pulmonary vasculature. No filling defects or pulmonary emboli. Mediastinum/Nodes: No enlarged mediastinal, hilar, or axillary lymph nodes. Thyroid gland, trachea, and esophagus demonstrate no significant findings. Lungs/Pleura: There is patchy ground-glass airspace disease within the dependent lower lobes, left greater than right, with mild lower lobe  bronchial wall thickening. This may be related to infection, inflammation, or aspiration. No effusion or pneumothorax. Musculoskeletal: No acute or destructive bony abnormalities. Reconstructed images demonstrate no additional findings. Review of the MIP images confirms the  above findings. CTA ABDOMEN AND PELVIS FINDINGS VASCULAR Aorta: Normal caliber aorta without aneurysm, dissection, vasculitis or significant stenosis. Celiac: Patent without evidence of aneurysm, dissection, vasculitis or significant stenosis. SMA: Patent without evidence of aneurysm, dissection, vasculitis or significant stenosis. Renals: Both renal arteries are patent without evidence of aneurysm, dissection, vasculitis, fibromuscular dysplasia or significant stenosis. IMA: Patent without evidence of aneurysm, dissection, vasculitis or significant stenosis. Inflow: Patent without evidence of aneurysm, dissection, vasculitis or significant stenosis. Minimal atherosclerosis. Veins: No obvious venous abnormality within the limitations of this arterial phase study. Review of the MIP images confirms the above findings. NON-VASCULAR Hepatobiliary: Hepatic steatosis. No focal liver abnormality. The gallbladder is unremarkable. Pancreas: Unremarkable. No pancreatic ductal dilatation or surrounding inflammatory changes. Spleen: Normal in size without focal abnormality. Adrenals/Urinary Tract: Kidneys are unremarkable with no urinary tract calculi or obstructive uropathy. The adrenals are stable. Bladder is unremarkable. Stomach/Bowel: No bowel obstruction or ileus. Scattered colonic diverticulosis without diverticulitis. No bowel wall thickening or inflammatory change. Lymphatic: No pathologic adenopathy. Reproductive: Prostate is unremarkable. Other: No free fluid or free intraperitoneal gas. No abdominal wall hernia. Musculoskeletal: No acute or destructive bony abnormalities. Reconstructed images demonstrate no additional findings. Review of the MIP  images confirms the above findings. IMPRESSION: Vascular: 1. 4 cm ascending thoracic aortic aneurysm. Recommend annual imaging followup by CTA or MRA. This recommendation follows 2010 ACCF/AHA/AATS/ACR/ASA/SCA/SCAI/SIR/STS/SVM Guidelines for the Diagnosis and Management of Patients with Thoracic Aortic Disease. Circulation. 2010; 121: Z733-z630. Aortic aneurysm NOS (ICD10-I71.9) 2. No evidence of thoracoabdominal aortic dissection. 3. No evidence of pulmonary embolus. 4. Coronary artery atherosclerosis. Nonvascular: 1. Patchy bibasilar ground-glass airspace disease and lower lobe bronchial wall thickening, compatible with inflammation, infection, or aspiration. 2. Hepatic steatosis. 3. Scattered colonic diverticulosis without diverticulitis. Electronically Signed   By: Ozell Daring M.D.   On: 05/04/2024 15:10   DG Chest Portable 1 View Result Date: 05/04/2024 CLINICAL DATA:  Weakness EXAM: PORTABLE CHEST 1 VIEW COMPARISON:  October 07, 2015 FINDINGS: The cardiomediastinal silhouette is unchanged and enlarged in contour.Tortuous thoracic aorta. No pleural effusion. No pneumothorax. No acute pleuroparenchymal abnormality. IMPRESSION: No acute cardiopulmonary abnormality. Electronically Signed   By: Corean Salter M.D.   On: 05/04/2024 13:45     Procedures   Medications Ordered in the ED  norepinephrine  (LEVOPHED ) 4mg  in (0.016 mg/mL) premix infusion (0 mcg/min Intravenous Stopped 05/04/24 1732)  lactated ringers  bolus 500 mL (0 mLs Intravenous Stopped 05/04/24 1506)  ondansetron  (ZOFRAN ) injection 4 mg (4 mg Intravenous Given 05/04/24 1415)  fentaNYL  (SUBLIMAZE ) injection 50 mcg (50 mcg Intravenous Given 05/04/24 1448)  lactated ringers  bolus 1,000 mL (0 mLs Intravenous Stopped 05/04/24 1619)  calcium  gluconate 1 g/ 50 mL sodium chloride  IVPB (0 mg Intravenous Stopped 05/04/24 1619)  iohexol  (OMNIPAQUE ) 350 MG/ML injection 100 mL (100 mLs Intravenous Contrast Given 05/04/24 1456)   cefTRIAXone  (ROCEPHIN ) 1 g in sodium chloride  0.9 % 100 mL IVPB (0 g Intravenous Stopped 05/04/24 1732)  azithromycin  (ZITHROMAX ) 500 mg in sodium chloride  0.9 % 250 mL IVPB (500 mg Intravenous New Bag/Given 05/04/24 1638)                                    Medical Decision Making Amount and/or Complexity of Data Reviewed Labs: ordered. Radiology: ordered.  Risk Prescription drug management.   This patient presents to the ED for concern of generalized weakness, this involves an extensive number of treatment options,  and is a complaint that carries with it a high risk of complications and morbidity.  The differential diagnosis includes infection, metabolic derangements, anemia, dehydration, deconditioning, polypharmacy   Co morbidities / Chronic conditions that complicate the patient evaluation  HTN, gout   Additional history obtained:  Additional history obtained from EMR External records from outside source obtained and reviewed including EMS, patient's wife   Lab Tests:  I Ordered, and personally interpreted labs.  The pertinent results include: Leukocytosis and lactic acidosis are present concerning for sepsis; kidney function is normal.  Hypocalcemia is present with otherwise normal electrolytes.  Blood sugar is elevated without evidence of DKA.   Imaging Studies ordered:  I ordered imaging studies including chest x-ray, CT head, CTA dissection study I independently visualized and interpreted imaging which showed patchy bibasilar airspace disease concerning for infection and/or aspiration I agree with the radiologist interpretation   Cardiac Monitoring: / EKG:  The patient was maintained on a cardiac monitor.  I personally viewed and interpreted the cardiac monitored which showed an underlying rhythm of: Sinus rhythm   Problem List / ED Course / Critical interventions / Medication management  Patient presenting for worsening generalized weakness over the past 3  days.  On arrival in the ED, he is alert and oriented.  He is noted to be diaphoretic.  EMS reported normal vital signs and a CBG of 250 prior to arrival.  Patient's current breathing is unlabored.  Abdomen is soft and nontender.  He has no focal neurologic deficits.  Initially he denied any areas of discomfort.  Patient, however, does appear uncomfortable.  When questioned further, he states that he hurts in his head and his back.  IV fluids were ordered for hydration.  Workup was initiated.  Patient has softening of blood pressures while in the ED.  He was placed on Levophed  as IV fluid hydration continued.  Imaging studies show bibasilar aspiration and/for pneumonia.  Antibiotics were ordered.  At 5 PM, he was completely weaned off of Levophed .  He remained normotensive after that for greater than 1 hour.  He is hemodynamically stable for admission. I ordered medication including IV fluids for hydration, Levophed  for hypotension,Ceftriaxone  and azithromycin  for pneumonia, calcium  gluconate for hypocalcemia, fentanyl  for analgesia Reevaluation of the patient after these medicines showed that the patient improved I have reviewed the patients home medicines and have made adjustments as needed  Social Determinants of Health:  Lives at home with family CRITICAL CARE Performed by: Bernardino Fireman   Total critical care time: 32 minutes  Critical care time was exclusive of separately billable procedures and treating other patients.  Critical care was necessary to treat or prevent imminent or life-threatening deterioration.  Critical care was time spent personally by me on the following activities: development of treatment plan with patient and/or surrogate as well as nursing, discussions with consultants, evaluation of patient's response to treatment, examination of patient, obtaining history from patient or surrogate, ordering and performing treatments and interventions, ordering and review of laboratory  studies, ordering and review of radiographic studies, pulse oximetry and re-evaluation of patient's condition.      Final diagnoses:  Pneumonia of both lower lobes due to infectious organism  Sepsis, due to unspecified organism, unspecified whether acute organ dysfunction present [facility])    ED Discharge Orders     None          Fireman Bernardino, MD 05/04/24 [phone]

## 2024-05-04 NOTE — ED Notes (Signed)
 CT called, pt accompanied over by this paramedic

## 2024-05-04 NOTE — H&P (Signed)
 History and Physical  Shawn Lawrence FMW:[id number] DOB: 23-Feb-1961 DOA: 05/04/2024  PCP: Sim Emery CROME, MD   Chief Complaint: Generalized weakness and diaphoresis  HPI: Shawn Lawrence is a 63 y.o. male with medical history significant for HTN, T2DM, HLD, OSA, obesity and DISH (diffuse idiopathic skeletal hyperostosis) who presented to the ED for evaluation of generalized weakness and diaphoresis. Patient reports he he was in his usual state of health until 2 days ago when he started feeling unwell with some nausea.  While at work today, he felt tired and decided to take a nap. When he woke up, he was significantly weak and diaphoretic so his coworkers called EMS for evaluation.  He reports a chronic low back pain that has been slightly worse for the last few days and poor appetite today but denies any cough, shortness of breath, fever, chills, headache, dizziness, abdominal pain, vomiting, dysuria, constipation, diarrhea or chest pain.  ED Course: Initial vitals show temp 97.4, RR 17-24, HR 80-90s, hypotensive with SBP in the 80s, requiring Levophed  briefly, SpO2 100% on room air. Initial labs significant for WBC 19.4, Hgb 12.7, glucose 234, calcium  8.6, lactic acid 2.8, normal troponin, normal renal function and LFTs, UA with no signs of infection. EKG shows sinus rhythm with LVH and nonspecific ST changes.  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  CTA chest/abdomen/pelvis negative for PE, dissection, aneurysm but shows patchy bibasilar ground glass airspace disease and lower lobe bronchial wall thickening compatible with inflammation, infection or aspiration.  Required Levophed  up to 10 mcg for 3 hours.  He received IV LR 1 L bolus, IV fentanyl , IV calcium  gluconate, IV Zofran , IV azithromycin  and IV Rocephin . [facility] was consulted for admission.   Review of Systems: Please see HPI for pertinent positives and negatives. A complete 10 system  review of systems are otherwise negative.  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 He has never used smokeless tobacco. He reports that he does not drink alcohol and does not use drugs.  Allergies  Allergen Reactions   Hctz [Hydrochlorothiazide] Other (See Comments)    Exacerbation of gout    Family History  Problem Relation Age of Onset   Hypertension Mother    Heart disease Mother 29       died of MI   Diabetes Father    Kidney disease Father    Hypertension Brother    Hypertension Brother      Prior to Admission medications   Medication Sig Start Date End Date Taking? Authorizing Provider  amLODipine  (NORVASC ) 10 MG tablet Take 1 tablet (10 mg total) by mouth daily. IM Program. 12/05/18   Tysinger, Alm RAMAN, PA-C  amLODipine  (NORVASC ) 10 MG tablet Take 1 tablet (10 mg total) by mouth daily. 08/03/23     amLODipine  (NORVASC ) 10 MG tablet Take 1 tablet (10 mg total) by mouth daily. 03/19/24     Ascorbic Acid  (VITAMIN C) 250 MG CHEW Chew 2 tablets by mouth daily.    [provider]  carvedilol  (COREG ) 3.125 MG tablet Take 1 tablet (3.125 mg total) by mouth 2 (two) times  daily with a meal. Patient not taking: Reported on 05/10/2020 12/05/18   Tysinger, Alm RAMAN, PA-C  carvedilol  (COREG ) 6.25 MG tablet Take 6.25 mg by mouth 2 (two) times daily. 05/04/20   [provider]  carvedilol  (COREG ) 6.25 MG tablet Take 1 tablet (6.25 mg total) by mouth 2 (two) times daily. 03/19/24     cloNIDine  (CATAPRES ) 0.1 MG tablet Take 0.1 mg by mouth 2 (two) times daily. 04/21/20   [provider]  cloNIDine  (CATAPRES ) 0.1 MG tablet TAKE 1 TABLET BY MOUTH TWO TIMES DAILY 09/16/20   Sim Emery CROME, MD  cloNIDine  (CATAPRES )  0.1 MG tablet Take 1 tablet (0.1 mg total) by mouth 2 (two) times daily. 03/19/24     diclofenac  Sodium (VOLTAREN ) 1 % GEL Apply topically to affected area 4 (four) times daily. 04/08/22     lidocaine  (LIDODERM ) 5 % Place 1 patch onto the skin daily. Remove & Discard patch within 12 hours or as directed by MD 04/08/23   Henderly, Britni A, PA-C  lisinopril  (ZESTRIL ) 40 MG tablet Take 1 tablet (40 mg total) by mouth daily. 12/05/18   Tysinger, Alm RAMAN, PA-C  lisinopril  (ZESTRIL ) 40 MG tablet Take 1 tablet (40 mg total) by mouth daily. 03/19/24     meloxicam  (MOBIC ) 7.5 MG tablet TAKE 1 TABLET (7.5 MG TOTAL) BY MOUTH DAILY. Patient not taking: Reported on 05/10/2020 05/09/18   Tysinger, Alm RAMAN, PA-C  metFORMIN  (GLUCOPHAGE ) 500 MG tablet Take 1 tablet (500 mg total) by mouth 2 (two) times daily. 12/12/23   Sim Emery CROME, MD  methylPREDNISolone  (MEDROL  DOSEPAK) 4 MG TBPK tablet See pack instructions 10/26/23   Silver Fell A, PA  oxyCODONE  (ROXICODONE ) 5 MG immediate release tablet Take 1/2 tablet (2.5 mg total) by mouth every 6 (six) hours as needed for severe pain (pain score 7-10). 10/26/23   Silver Fell LABOR, PA  traZODone  (DESYREL ) 50 MG tablet Take 1 tablet (50 mg total) by mouth at bedtime. 12/11/23   Sim Emery CROME, MD    Physical Exam: BP (!) 123/97   Pulse 96   Temp 97.6 F (36.4 C) (Oral)   Resp 16   Ht 6' (1.829 m)   Wt 127 kg   SpO2 100%   BMI 37.97 kg/m  General: Pleasant, acutely ill and weak appearing obese male laying in bed. No acute distress. HEENT: [place]/AT. Anicteric sclera CV: RRR. No murmurs, rubs, or gallops. No LE edema Pulmonary: Lungs CTAB. Normal effort. No wheezing or rales. Abdominal: Soft, nontender, nondistended. Normal bowel sounds. Extremities: Palpable radial and DP pulses. Normal ROM. Skin: Warm and dry. No obvious rash or lesions. Neuro: A&Ox3.  Generalized weakness. Moves all extremities. Normal sensation to light touch. No focal deficit. Psych:  Normal mood and affect          Labs on Admission:  Basic Metabolic Panel: Recent Labs  Lab 05/04/24 1345 05/04/24 1358  NA 138 139  K 4.3 4.2  CL 108 113*  CO2 25  --   GLUCOSE 234* 264*  BUN 27* 31*  CREATININE 0.95 0.80  CALCIUM  8.6*  --   MG 1.9  --    Liver Function Tests: Recent Labs  Lab 05/04/24 1345  AST 24  ALT 17  ALKPHOS 65  BILITOT 0.9  PROT 7.3  ALBUMIN 2.7*   Recent Labs  Lab 05/04/24 1345  LIPASE 31   No results for input(s): AMMONIA in the last 168 hours. CBC: Recent Labs  Lab 05/04/24 1345  05/04/24 1358  WBC 19.4*  --   NEUTROABS 7.4  --   HGB 12.7* 12.6*  HCT 38.5* 37.0*  MCV 87.1  --   PLT 348  --    Cardiac Enzymes: No results for input(s): CKTOTAL, CKMB, CKMBINDEX, TROPONINI in the last 168 hours. BNP (last 3 results) Recent Labs    05/04/24 1332  BNP 2.3    ProBNP (last 3 results) No results for input(s): PROBNP in the last 8760 hours.  CBG: Recent Labs  Lab 05/04/24 1353  GLUCAP 254*    Radiological Exams on Admission: CT Head Wo Contrast Result Date: 05/04/2024 CLINICAL DATA:  New onset headache EXAM: CT HEAD WITHOUT CONTRAST TECHNIQUE: Contiguous axial images were obtained from the base of the skull through the vertex without intravenous contrast. RADIATION DOSE REDUCTION: This exam was performed according to the departmental dose-optimization program which includes automated exposure control, adjustment of the mA and/or kV according to patient size and/or use of iterative reconstruction technique. COMPARISON:  03/02/2018 FINDINGS: Brain: No acute infarct or hemorrhage. Lateral ventricles and midline structures are stable, with a 6 mm colloid cyst again noted. No acute extra-axial fluid collections. No mass effect. Vascular: No hyperdense vessel or unexpected calcification. Skull: Normal. Negative for fracture or focal lesion. Sinuses/Orbits: No acute finding. Other: None. IMPRESSION: 1. No acute intracranial  process. 2. 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 Electronically Signed   By: Ozell Daring M.D.   On: 05/04/2024 15:12   CT Angio Chest/Abd/Pel for Dissection W and/or Wo Contrast Result Date: 05/04/2024 CLINICAL DATA:  Acute aortic syndrome, weakness, headache EXAM: CT ANGIOGRAPHY CHEST, ABDOMEN AND PELVIS TECHNIQUE: Non-contrast CT of the chest was initially obtained. Multidetector CT imaging through the chest, abdomen and pelvis was performed using the standard protocol during bolus administration of intravenous contrast. Multiplanar reconstructed images and MIPs were obtained and reviewed to evaluate the vascular anatomy. RADIATION DOSE REDUCTION: This exam was performed according to the departmental dose-optimization program which includes automated exposure control, adjustment of the mA and/or kV according to patient size and/or use of iterative reconstruction technique. CONTRAST:  OMNIPAQUE  IOHEXOL  350 MG/ML SOLN COMPARISON:  05/10/2020, 05/04/2024 FINDINGS: CTA CHEST FINDINGS Cardiovascular: There is a 4 cm ascending thoracic aortic aneurysm. No evidence of thoracic aortic dissection. The heart is unremarkable without pericardial effusion. Prominent atherosclerosis within the LAD distribution of the coronary vasculature. There is technically adequate opacification of the pulmonary vasculature. No filling defects or pulmonary emboli. Mediastinum/Nodes: No enlarged mediastinal, hilar, or axillary lymph nodes. Thyroid gland, trachea, and esophagus demonstrate no significant findings. Lungs/Pleura: There is patchy ground-glass airspace disease within the dependent lower lobes, left greater than right, with mild lower lobe bronchial wall thickening. This may be related to infection, inflammation, or aspiration. No effusion or pneumothorax. Musculoskeletal: No acute or destructive bony abnormalities. Reconstructed images  demonstrate no additional findings. Review of the MIP images confirms the above findings. CTA ABDOMEN AND PELVIS FINDINGS VASCULAR Aorta: Normal caliber aorta without aneurysm, dissection, vasculitis or significant stenosis. Celiac: Patent without evidence of aneurysm, dissection, vasculitis or significant stenosis. SMA: Patent without evidence of aneurysm, dissection, vasculitis or significant stenosis. Renals: Both renal arteries are patent without evidence of aneurysm, dissection, vasculitis, fibromuscular dysplasia or significant stenosis. IMA: Patent without evidence of aneurysm, dissection, vasculitis or significant stenosis. Inflow: Patent without evidence of aneurysm, dissection, vasculitis or significant stenosis. Minimal atherosclerosis. Veins: No obvious venous abnormality within the  limitations of this arterial phase study. Review of the MIP images confirms the above findings. NON-VASCULAR Hepatobiliary: Hepatic steatosis. No focal liver abnormality. The gallbladder is unremarkable. Pancreas: Unremarkable. No pancreatic ductal dilatation or surrounding inflammatory changes. Spleen: Normal in size without focal abnormality. Adrenals/Urinary Tract: Kidneys are unremarkable with no urinary tract calculi or obstructive uropathy. The adrenals are stable. Bladder is unremarkable. Stomach/Bowel: No bowel obstruction or ileus. Scattered colonic diverticulosis without diverticulitis. No bowel wall thickening or inflammatory change. Lymphatic: No pathologic adenopathy. Reproductive: Prostate is unremarkable. Other: No free fluid or free intraperitoneal gas. No abdominal wall hernia. Musculoskeletal: No acute or destructive bony abnormalities. Reconstructed images demonstrate no additional findings. Review of the MIP images confirms the above findings. IMPRESSION: Vascular: 1. 4 cm ascending thoracic aortic aneurysm. Recommend annual imaging followup by CTA or MRA. This recommendation follows 2010  ACCF/AHA/AATS/ACR/ASA/SCA/SCAI/SIR/STS/SVM Guidelines for the Diagnosis and Management of Patients with Thoracic Aortic Disease. Circulation. 2010; 121: Z733-z630. Aortic aneurysm NOS (ICD10-I71.9) 2. No evidence of thoracoabdominal aortic dissection. 3. No evidence of pulmonary embolus. 4. Coronary artery atherosclerosis. Nonvascular: 1. Patchy bibasilar ground-glass airspace disease and lower lobe bronchial wall thickening, compatible with inflammation, infection, or aspiration. 2. Hepatic steatosis. 3. Scattered colonic diverticulosis without diverticulitis. Electronically Signed   By: Ozell Daring M.D.   On: 05/04/2024 15:10   DG Chest Portable 1 View Result Date: 05/04/2024 CLINICAL DATA:  Weakness EXAM: PORTABLE CHEST 1 VIEW COMPARISON:  October 07, 2015 FINDINGS: The cardiomediastinal silhouette is unchanged and enlarged in contour.Tortuous thoracic aorta. No pleural effusion. No pneumothorax. No acute pleuroparenchymal abnormality. IMPRESSION: No acute cardiopulmonary abnormality. Electronically Signed   By: Corean Salter M.D.   On: 05/04/2024 13:45   Assessment/Plan Shawn Lawrence is a 63 y.o. male with medical history significant for HTN, T2DM, HLD, OSA, obesity and DISH (diffuse idiopathic skeletal hyperostosis) who presented to the ED for evaluation of generalized weakness and diaphoresis and admitted for sepsis secondary to pneumonia  # Sepsis secondary to pneumonia - Pt presented with generalized weakness, mild nausea and diaphoresis - Chest imaging shows patchy bibasilar ground glass airspace disease - Met sepsis criteria with leukocytosis, hypotension, lactic acidosis and evidence of respiratory infection - Continue IV Rocephin  and azithromycin  - Start IV NS 150 cc/h for 1 day - Follow-up blood culture and sputum culture - Check MRSA screen, viral panel, procalcitonin, urinary Legionella and strep pneumo - Supplemental O2 as needed - Trend CBC, fever curve  #  Hypotension, resolved - Patient became hypotensive with SBP in the 80s in the ED requiring Levophed  for few hours.   - His BP has not improved with SBP in the 120s to 130s off Levophed  - Continue IV hydration as above  # Hx of hypertension - Will hold BP meds for today and resume when appropriate  # T2DM with hyperglycemia - No recent A1c on file, blood sugar elevated to the 200s on admission - Reports she was prescribed metformin  by his PCP but discontinued on due to GI side effects - SSI with meals, CBG monitoring - Follow-up repeat A1c  # Hx of DISH ((diffuse idiopathic skeletal hyperostosis) # Chronic low back pain - Reports slight worsening of his low back pain over the last few days - Multimodal pain control with scheduled Tylenol  and lidocaine  patch  # OSA - Not on CPAP  # Generalized weakness - In the setting of acute illness - PT/OT eval and treat  # Class II obesity Body mass index is 37.97 kg/m. Filed  Weights   05/04/24 1344  Weight: 127 kg  - F/u with PCP for weight lost and nutrition counseling  DVT prophylaxis: Lovenox      Code Status: Full Code  Consults called: None  Family Communication: Discussed admission with friends and family at bedside  Severity of Illness: The appropriate patient status for this patient is INPATIENT. Inpatient status is judged to be reasonable and necessary in order to provide the required intensity of service to ensure the patient's safety. The patient's presenting symptoms, physical exam findings, and initial radiographic and laboratory data in the context of their chronic comorbidities is felt to place them at high risk for further clinical deterioration. Furthermore, it is not anticipated that the patient will be medically stable for discharge from the hospital within 2 midnights of admission.   * I certify that at the point of admission it is my clinical judgment that the patient will require inpatient hospital care spanning  beyond 2 midnights from the point of admission due to high intensity of service, high risk for further deterioration and high frequency of surveillance required.*  Level of care: Progressive    Lou Claretta HERO, MD 05/04/2024, 7:25 PM [facility] Pager: [phone] Isaiah 41:10   If 7PM-7AM, please contact night-coverage [email or web address] Password [email or web address]

## 2024-05-04 NOTE — ED Triage Notes (Signed)
 Pt BIB [facility] from work d/t 2 days of generalized weakness & being diaphoretic 1 hr prior to called EMS. A/Ox4, 124/82, 90 bpm, 99% on RA, CBG 218, 12L unremarkable, 20g Lt AC PIV. Denies any current n/v/d, does endorse vomiting yesterday. EMS reports it appears he is very uncomfortable but pt denies any acute pain.

## 2024-05-04 NOTE — ED Notes (Signed)
 This paramedic returned to room with patient from CT

## 2024-05-05 LAB — BASIC METABOLIC PANEL WITH GFR
Anion gap: 3 — ABNORMAL LOW (ref 5–15)
BUN: 29 mg/dL — ABNORMAL HIGH (ref 8–23)
CO2: 28 mmol/L (ref 22–32)
Calcium: 8.7 mg/dL — ABNORMAL LOW (ref 8.9–10.3)
Chloride: 111 mmol/L (ref 98–111)
Creatinine, Ser: 0.88 mg/dL (ref 0.61–1.24)
GFR, Estimated: >60 mL/min (ref >60)
Glucose, Bld: 130 mg/dL — ABNORMAL HIGH (ref 70–99)
Potassium: 4.2 mmol/L (ref 3.5–5.1)
Sodium: 142 mmol/L (ref 135–145)

## 2024-05-05 LAB — CBC
HCT: 32.7 % — ABNORMAL LOW (ref 39.0–52.0)
Hemoglobin: 10.4 g/dL — ABNORMAL LOW (ref 13.0–17.0)
MCH: 27.9 pg (ref 26.0–34.0)
MCHC: 31.8 g/dL (ref 30.0–36.0)
MCV: 87.7 fL (ref 80.0–100.0)
Platelets: 260 K/uL (ref 150–400)
RBC: 3.73 MIL/uL — ABNORMAL LOW (ref 4.22–5.81)
RDW: 13.9 % (ref 11.5–15.5)
WBC: 21.2 K/uL — ABNORMAL HIGH (ref 4.0–10.5)
nRBC: 0 % (ref 0.0–0.2)

## 2024-05-05 LAB — GLUCOSE, CAPILLARY
Glucose-Capillary: 149 mg/dL — ABNORMAL HIGH (ref 70–99)
Glucose-Capillary: 180 mg/dL — ABNORMAL HIGH (ref 70–99)
Glucose-Capillary: 128 mg/dL — ABNORMAL HIGH (ref 70–99)
Glucose-Capillary: 170 mg/dL — ABNORMAL HIGH (ref 70–99)

## 2024-05-05 LAB — HIV ANTIBODY (ROUTINE TESTING W REFLEX): HIV Screen 4th Generation wRfx: NONREACTIVE

## 2024-05-05 LAB — STREP PNEUMONIAE URINARY ANTIGEN: Strep Pneumo Urinary Antigen: NEGATIVE

## 2024-05-05 LAB — LACTIC ACID, PLASMA: Lactic Acid, Venous: 1.4 mmol/L (ref 0.5–1.9)

## 2024-05-05 LAB — PROCALCITONIN: Procalcitonin: 0.11 ng/mL

## 2024-05-05 NOTE — Progress Notes (Signed)
 PROGRESS NOTE    Shawn Lawrence  FMW:[id number] DOB: 07/28/1960 DOA: 05/04/2024 PCP: Sim Emery CROME, MD   Brief Narrative:  HPI: Shawn Lawrence is a 63 y.o. male with medical history significant for HTN, T2DM, HLD, OSA, obesity and DISH (diffuse idiopathic skeletal hyperostosis) who presented to the ED for evaluation of generalized weakness and diaphoresis. Patient reports he he was in his usual state of health until 2 days ago when he started feeling unwell with some nausea.  While at work today, he felt tired and decided to take a nap. When he woke up, he was significantly weak and diaphoretic so his coworkers called EMS for evaluation.  He reports a chronic low back pain that has been slightly worse for the last few days and poor appetite today but denies any cough, shortness of breath, fever, chills, headache, dizziness, abdominal pain, vomiting, dysuria, constipation, diarrhea or chest pain.   ED Course: Initial vitals show temp 97.4, RR 17-24, HR 80-90s, hypotensive with SBP in the 80s, requiring Levophed  briefly, SpO2 100% on room air. Initial labs significant for WBC 19.4, Hgb 12.7, glucose 234, calcium  8.6, lactic acid 2.8, normal troponin, normal renal function and LFTs, UA with no signs of infection. EKG shows sinus rhythm with LVH and nonspecific ST changes.  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  CTA chest/abdomen/pelvis negative for PE, dissection, aneurysm but shows patchy bibasilar ground glass airspace disease and lower lobe bronchial wall thickening compatible with inflammation, infection or aspiration.  Required Levophed  up to 10 mcg for 3 hours.  He received IV LR 1 L bolus, IV fentanyl , IV calcium  gluconate, IV Zofran , IV azithromycin  and IV Rocephin . [facility] was consulted for admission.   Assessment & Plan:   Principal Problem:   Sepsis due to pneumonia [facility]) Active Problems:   OSA (obstructive sleep apnea)    Hypotension due to hypovolemia   Type 2 diabetes mellitus with hyperglycemia, without long-term current use of insulin  (HCC)   Obesity, Class II, BMI 35-39.9   Chronic bilateral low back pain   Pneumonia of both lower lobes due to infectious organism  Septic shock secondary to community-acquired pneumonia, POA: Patient met criteria for septic shock based on tachypnea, leukocytosis, lactic acidosis of 2.8, hypotension requiring vasopressors for 3 hours and evidence of pneumonia.  Blood pressure is much better, receiving IV fluids.  Holding antihypertensives.  Cultures negative.  Pending urine antigens.  On Rocephin  and Zithromax  which we will continue.  Leukocytosis slightly worsened but he is afebrile without hypoxia.  Repeating lactic acid.   Hx of hypertension - Will hold BP meds for today and monitor for now.   T2DM with hyperglycemia - No recent A1c on file, blood sugar elevated to the 200s on admission - Reports he was prescribed metformin  by his PCP but discontinued on due to GI side effects.  Hemoglobin A1c 7.5.  Currently on SSI.   Hx of DISH ((diffuse idiopathic skeletal hyperostosis)  Chronic low back pain - Reports slight worsening of his low back pain over the last few days - Multimodal pain control with scheduled Tylenol  and lidocaine  patch   OSA - Not on CPAP   Generalized weakness - In the setting of acute illness - PT/OT eval and treat   Class II obesity Body mass index is 37.97 kg/m.  Ascending thoracic aortic aneurysm: 4 cm ascending thoracic aortic aneurysm. Recommend annual imaging followup by CTA or MRA. This recommendation follows 2010 ACCF/AHA/AATS/ACR/ASA/SCA/SCAI/SIR/STS/SVM  Guidelines for the Diagnosis and Management of Patients with Thoracic Aortic Disease. Circulation. 2010; 121: Z733-z630. Aortic aneurysm NOS (ICD10-I71.9)  DVT prophylaxis: Lovenox    Code Status: Full Code  Family Communication: Multiple family members including wife and friends  present at bedside.  Plan of care discussed with patient in length and he/she verbalized understanding and agreed with it.  Status is: Inpatient Remains inpatient appropriate because: Still feels weak, PT OT assessment pending, due to requiring vasopressors yesterday, recommend observing overnight and making sure he is fully stable prior to potentially discharging tomorrow.   Estimated body mass index is 38.75 kg/m as calculated from the following:   Height as of this encounter: 6' (1.829 m).   Weight as of this encounter: 129.6 kg.    Nutritional Assessment: Body mass index is 38.75 kg/m.[name] Seen by dietician.  I agree with the assessment and plan as outlined below: Nutrition Status:        . Skin Assessment: I have examined the patient's skin and I agree with the wound assessment as performed by the wound care RN as outlined below:    Consultants:  None   Procedures:  None  Antimicrobials:  Anti-infectives (From admission, onward)    Start     Dose/Rate Route Frequency Ordered Stop   05/05/24 1600  cefTRIAXone  (ROCEPHIN ) 1 g in sodium chloride  0.9 % 100 mL IVPB        1 g 200 mL/hr over 30 Minutes Intravenous Every 24 hours 05/04/24 1947 05/09/24 1559   05/05/24 1000  azithromycin  (ZITHROMAX ) tablet 500 mg        500 mg Oral Daily 05/04/24 1947 05/09/24 0959   05/04/24 1545  cefTRIAXone  (ROCEPHIN ) 1 g in sodium chloride  0.9 % 100 mL IVPB        1 g 200 mL/hr over 30 Minutes Intravenous  Once 05/04/24 1541 05/04/24 1732   05/04/24 1545  azithromycin  (ZITHROMAX ) 500 mg in sodium chloride  0.9 % 250 mL IVPB        500 mg 250 mL/hr over 60 Minutes Intravenous  Once 05/04/24 1541 05/04/24 1908         Subjective: Patient seen and examined.  His only complaint is just generalized weakness and bodyaches.  No diaphoresis, chest pain, fever, shortness of breath or any other complaint.  Objective: Vitals:   05/04/24 1935 05/04/24 1940 05/04/24 2300 05/05/24 0401  BP:   (!) 167/96 137/85 (!) 151/93  Pulse:  (!) 117 94 93  Resp:  18 14 15   Temp:  97.8 F (36.6 C) 97.7 F (36.5 C) 98.2 F (36.8 C)  TempSrc:  Oral Oral Oral  SpO2:  94% 97% 99%  Weight: 129.6 kg     Height:        Intake/Output Summary (Last 24 hours) at 05/05/2024 0856 Last data filed at 05/04/2024 2300 Gross per 24 hour  Intake 1917.53 ml  Output 1 ml  Net 1916.53 ml   Filed Weights   05/04/24 1344 05/04/24 1935  Weight: 127 kg 129.6 kg    Examination:  General exam: Appears calm and comfortable  Respiratory system: Clear to auscultation. Respiratory effort normal. Cardiovascular system: S1 & S2 heard, RRR. No JVD, murmurs, rubs, gallops or clicks. No pedal edema. Gastrointestinal system: Abdomen is nondistended, soft and nontender. No organomegaly or masses felt. Normal bowel sounds heard. Central nervous system: Alert and oriented. No focal neurological deficits. Extremities: Symmetric 5 x 5 power. Skin: No rashes, lesions or ulcers Psychiatry: Judgement and insight  appear normal. Mood & affect appropriate.    Data Reviewed: I have personally reviewed following labs and imaging studies  CBC: Recent Labs  Lab 05/04/24 1345 05/04/24 1358 05/05/24 0111  WBC 19.4*  --  21.2*  NEUTROABS 7.4  --   --   HGB 12.7* 12.6* 10.4*  HCT 38.5* 37.0* 32.7*  MCV 87.1  --  87.7  PLT 348  --  260   Basic Metabolic Panel: Recent Labs  Lab 05/04/24 1345 05/04/24 1358 05/05/24 0111  NA 138 139 142  K 4.3 4.2 4.2  CL 108 113* 111  CO2 25  --  28  GLUCOSE 234* 264* 130*  BUN 27* 31* 29*  CREATININE 0.95 0.80 0.88  CALCIUM  8.6*  --  8.7*  MG 1.9  --   --    GFR: Estimated Creatinine Clearance: 119.6 mL/min (by C-G formula based on SCr of 0.88 mg/dL). Liver Function Tests: Recent Labs  Lab 05/04/24 1345  AST 24  ALT 17  ALKPHOS 65  BILITOT 0.9  PROT 7.3  ALBUMIN 2.7*   Recent Labs  Lab 05/04/24 1345  LIPASE 31   No results for input(s): AMMONIA in the  last 168 hours. Coagulation Profile: No results for input(s): INR, PROTIME in the last 168 hours. Cardiac Enzymes: No results for input(s): CKTOTAL, CKMB, CKMBINDEX, TROPONINI in the last 168 hours. BNP (last 3 results) No results for input(s): PROBNP in the last 8760 hours. HbA1C: Recent Labs    05/04/24 2023  HGBA1C 7.5*   CBG: Recent Labs  Lab 05/04/24 1353 05/04/24 2125 05/05/24 0611  GLUCAP 254* 187* 149*   Lipid Profile: No results for input(s): CHOL, HDL, LDLCALC, TRIG, CHOLHDL, LDLDIRECT in the last 72 hours. Thyroid Function Tests: Recent Labs    05/04/24 2023  TSH 0.339*   Anemia Panel: No results for input(s): VITAMINB12, FOLATE, FERRITIN, TIBC, IRON , RETICCTPCT in the last 72 hours. Sepsis Labs: Recent Labs  Lab 05/04/24 1653 05/05/24 0111  PROCALCITON  --  0.11  LATICACIDVEN 2.8*  --     Recent Results (from the past 240 hours)  Respiratory (~20 pathogens) panel by PCR     Status: None   Collection Time: 05/04/24  7:22 PM   Specimen: Anterior Nasal Swab; Respiratory  Result Value Ref Range Status   Adenovirus NOT DETECTED NOT DETECTED Final   Coronavirus 229E NOT DETECTED NOT DETECTED Final    Comment: (NOTE) The Coronavirus on the Respiratory Panel, DOES NOT test for the novel  Coronavirus (2019 nCoV)    Coronavirus HKU1 NOT DETECTED NOT DETECTED Final   Coronavirus NL63 NOT DETECTED NOT DETECTED Final   Coronavirus OC43 NOT DETECTED NOT DETECTED Final   Metapneumovirus NOT DETECTED NOT DETECTED Final   Rhinovirus / Enterovirus NOT DETECTED NOT DETECTED Final   Influenza A NOT DETECTED NOT DETECTED Final   Influenza B NOT DETECTED NOT DETECTED Final   Parainfluenza Virus 1 NOT DETECTED NOT DETECTED Final   Parainfluenza Virus 2 NOT DETECTED NOT DETECTED Final   Parainfluenza Virus 3 NOT DETECTED NOT DETECTED Final   Parainfluenza Virus 4 NOT DETECTED NOT DETECTED Final   Respiratory Syncytial Virus NOT  DETECTED NOT DETECTED Final   Bordetella pertussis NOT DETECTED NOT DETECTED Final   Bordetella Parapertussis NOT DETECTED NOT DETECTED Final   Chlamydophila pneumoniae NOT DETECTED NOT DETECTED Final   Mycoplasma pneumoniae NOT DETECTED NOT DETECTED Final    Comment: Performed at [facility], [address] [place],  [place] [address]  SARS Coronavirus 2 by RT PCR (hospital order, performed in [facility] hospital lab) *cepheid single result test*     Status: None   Collection Time: 05/04/24  7:22 PM  Result Value Ref Range Status   SARS Coronavirus 2 by RT PCR NEGATIVE NEGATIVE Final    Comment: Performed at [facility], [address] [place], [place] [address]     Radiology Studies: CT Head Wo Contrast Result Date: 05/04/2024 CLINICAL DATA:  New onset headache EXAM: CT HEAD WITHOUT CONTRAST TECHNIQUE: Contiguous axial images were obtained from the base of the skull through the vertex without intravenous contrast. RADIATION DOSE REDUCTION: This exam was performed according to the departmental dose-optimization program which includes automated exposure control, adjustment of the mA and/or kV according to patient size and/or use of iterative reconstruction technique. COMPARISON:  03/02/2018 FINDINGS: Brain: No acute infarct or hemorrhage. Lateral ventricles and midline structures are stable, with a 6 mm colloid cyst again noted. No acute extra-axial fluid collections. No mass effect. Vascular: No hyperdense vessel or unexpected calcification. Skull: Normal. Negative for fracture or focal lesion. Sinuses/Orbits: No acute finding. Other: None. IMPRESSION: 1. No acute intracranial process. 2. 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 Electronically Signed   By: Ozell Daring M.D.   On: 05/04/2024 15:12   CT Angio Chest/Abd/Pel for Dissection W and/or Wo Contrast Result Date: 05/04/2024 CLINICAL DATA:  Acute  aortic syndrome, weakness, headache EXAM: CT ANGIOGRAPHY CHEST, ABDOMEN AND PELVIS TECHNIQUE: Non-contrast CT of the chest was initially obtained. Multidetector CT imaging through the chest, abdomen and pelvis was performed using the standard protocol during bolus administration of intravenous contrast. Multiplanar reconstructed images and MIPs were obtained and reviewed to evaluate the vascular anatomy. RADIATION DOSE REDUCTION: This exam was performed according to the departmental dose-optimization program which includes automated exposure control, adjustment of the mA and/or kV according to patient size and/or use of iterative reconstruction technique. CONTRAST:  OMNIPAQUE  IOHEXOL  350 MG/ML SOLN COMPARISON:  05/10/2020, 05/04/2024 FINDINGS: CTA CHEST FINDINGS Cardiovascular: There is a 4 cm ascending thoracic aortic aneurysm. No evidence of thoracic aortic dissection. The heart is unremarkable without pericardial effusion. Prominent atherosclerosis within the LAD distribution of the coronary vasculature. There is technically adequate opacification of the pulmonary vasculature. No filling defects or pulmonary emboli. Mediastinum/Nodes: No enlarged mediastinal, hilar, or axillary lymph nodes. Thyroid gland, trachea, and esophagus demonstrate no significant findings. Lungs/Pleura: There is patchy ground-glass airspace disease within the dependent lower lobes, left greater than right, with mild lower lobe bronchial wall thickening. This may be related to infection, inflammation, or aspiration. No effusion or pneumothorax. Musculoskeletal: No acute or destructive bony abnormalities. Reconstructed images demonstrate no additional findings. Review of the MIP images confirms the above findings. CTA ABDOMEN AND PELVIS FINDINGS VASCULAR Aorta: Normal caliber aorta without aneurysm, dissection, vasculitis or significant stenosis. Celiac: Patent without evidence of aneurysm, dissection, vasculitis or significant  stenosis. SMA: Patent without evidence of aneurysm, dissection, vasculitis or significant stenosis. Renals: Both renal arteries are patent without evidence of aneurysm, dissection, vasculitis, fibromuscular dysplasia or significant stenosis. IMA: Patent without evidence of aneurysm, dissection, vasculitis or significant stenosis. Inflow: Patent without evidence of aneurysm, dissection, vasculitis or significant stenosis. Minimal atherosclerosis. Veins: No obvious venous abnormality within the limitations of this arterial phase study. Review of the MIP images confirms the above findings. NON-VASCULAR Hepatobiliary: Hepatic steatosis. No focal liver abnormality. The gallbladder  is unremarkable. Pancreas: Unremarkable. No pancreatic ductal dilatation or surrounding inflammatory changes. Spleen: Normal in size without focal abnormality. Adrenals/Urinary Tract: Kidneys are unremarkable with no urinary tract calculi or obstructive uropathy. The adrenals are stable. Bladder is unremarkable. Stomach/Bowel: No bowel obstruction or ileus. Scattered colonic diverticulosis without diverticulitis. No bowel wall thickening or inflammatory change. Lymphatic: No pathologic adenopathy. Reproductive: Prostate is unremarkable. Other: No free fluid or free intraperitoneal gas. No abdominal wall hernia. Musculoskeletal: No acute or destructive bony abnormalities. Reconstructed images demonstrate no additional findings. Review of the MIP images confirms the above findings. IMPRESSION: Vascular: 1. 4 cm ascending thoracic aortic aneurysm. Recommend annual imaging followup by CTA or MRA. This recommendation follows 2010 ACCF/AHA/AATS/ACR/ASA/SCA/SCAI/SIR/STS/SVM Guidelines for the Diagnosis and Management of Patients with Thoracic Aortic Disease. Circulation. 2010; 121: Z733-z630. Aortic aneurysm NOS (ICD10-I71.9) 2. No evidence of thoracoabdominal aortic dissection. 3. No evidence of pulmonary embolus. 4. Coronary artery atherosclerosis.  Nonvascular: 1. Patchy bibasilar ground-glass airspace disease and lower lobe bronchial wall thickening, compatible with inflammation, infection, or aspiration. 2. Hepatic steatosis. 3. Scattered colonic diverticulosis without diverticulitis. Electronically Signed   By: Ozell Daring M.D.   On: 05/04/2024 15:10   DG Chest Portable 1 View Result Date: 05/04/2024 CLINICAL DATA:  Weakness EXAM: PORTABLE CHEST 1 VIEW COMPARISON:  October 07, 2015 FINDINGS: The cardiomediastinal silhouette is unchanged and enlarged in contour.Tortuous thoracic aorta. No pleural effusion. No pneumothorax. No acute pleuroparenchymal abnormality. IMPRESSION: No acute cardiopulmonary abnormality. Electronically Signed   By: Corean Salter M.D.   On: 05/04/2024 13:45    Scheduled Meds:  acetaminophen   1,000 mg Oral TID   azithromycin   500 mg Oral Daily   enoxaparin  (LOVENOX ) injection  60 mg Subcutaneous Q24H   insulin  aspart  0-15 Units Subcutaneous TID WC   insulin  aspart  0-5 Units Subcutaneous QHS   lidocaine   1 patch Transdermal Q24H   Continuous Infusions:  sodium chloride  150 mL/hr at 05/05/24 9766   cefTRIAXone  (ROCEPHIN )  IV       LOS: 1 day   Fredia Skeeter, MD [facility]  05/05/2024, 8:56 AM   *Please note that this is a verbal dictation therefore any spelling or grammatical errors are due to the Dragon Medical One system interpretation.  Please page via Amion and do not message via secure chat for urgent patient care matters. Secure chat can be used for non urgent patient care matters.  How to contact the [facility] Attending or Consulting provider 7A - 7P or covering provider during after hours 7P -7A, for this patient?  Check the care team in [facility] and look for a) attending/consulting [facility] provider listed and b) the [facility] team listed. Page or secure chat 7A-7P. Log into [email or web address] and use [place]'s universal password to access. If you do not have the password, please contact the hospital  operator. Locate the [facility] provider you are looking for under [facility] and page to a number that you can be directly reached. If you still have difficulty reaching the provider, please page the [facility] (Director on Call) for the Hospitalists listed on amion for assistance.

## 2024-05-05 NOTE — Evaluation (Signed)
 Physical Therapy Evaluation Patient Details Name: Shawn Lawrence MRN: [id number] DOB: Aug 21, 1960 Today's Date: 05/05/2024  History of Present Illness  The pt is a 63 yo male presenting 11/33 with weakness, nausea and vomiting. Admitted for PNA and sepsis. PMH includes: HTN, DM II, HLD, OSA, obesity (BMI 38), and DISH (diffuse idiopathic skeletal hyperostosis).  Clinical Impression  Pt in bed upon arrival of PT, agreeable to evaluation at this time. Prior to admission the pt was completely independent, working full time as an airline pilot, living in a 2-story home with his family. Pt reports limited exercise on a regular basis, but no issues with mobility. The pt was able to complete bed mobility, transfers, and initial hallway ambulation without assist or need for DME. However, with completion of 2 sets of 3 stairs, pt reports onset of 7/10 chest pressure and became diaphoretic requiring seated rest before returning to room. Chest pressure went away after ~5 min seated rest, and did not return with ambulation alone. Pt reports this chest pressure occurs randomly at home, not always related to exertion. Will continue to follow acutely but do not anticipate follow up PT needs after d/c at this time.     If plan is discharge home, recommend the following: A little help with walking and/or transfers;A lot of help with bathing/dressing/bathroom;Help with stairs or ramp for entrance   Can travel by private vehicle        Equipment Recommendations None recommended by PT  Recommendations for Other Services       Functional Status Assessment Patient has had a recent decline in their functional status and demonstrates the ability to make significant improvements in function in a reasonable and predictable amount of time.     Precautions / Restrictions Precautions Precautions: None Recall of Precautions/Restrictions: Intact Restrictions Weight Bearing Restrictions Per Provider Order: No       Mobility  Bed Mobility Overal bed mobility: Needs Assistance Bed Mobility: Supine to Sit     Supine to sit: Supervision          Transfers Overall transfer level: Needs assistance Equipment used: None Transfers: Sit to/from Stand Sit to Stand: Contact guard assist           General transfer comment: no UE support, good stability    Ambulation/Gait Ambulation/Gait assistance: Contact guard assist Gait Distance (Feet): 250 Feet (+ 257ft) Assistive device: None Gait Pattern/deviations: Step-through pattern, Decreased stride length, Wide base of support Gait velocity: WFL Gait velocity interpretation: >2.62 ft/sec, indicative of community ambulatory   General Gait Details: pt with good spped, reports at his normal, chest pressure after stairs navigation, required seated rest. pt diaphoretic, reports 7/10 chest pressure that resolved with 5 min seated rest. denies SOB  Stairs Stairs: Yes Stairs assistance: Min assist Stair Management: One rail Right, Alternating pattern, Forwards Number of Stairs: 3 (x2) General stair comments: fatigue and 7/10 chest pressure after stairs. required seated rest for 5 min.      Balance Overall balance assessment: Needs assistance Sitting-balance support: No upper extremity supported, Feet supported Sitting balance-Leahy Scale: Normal     Standing balance support: During functional activity, No upper extremity supported Standing balance-Leahy Scale: Good Standing balance comment: able to ambulate and tolerate mild balance challenge without issue                             Pertinent Vitals/Pain Pain Assessment Pain Assessment: 0-10 Pain Score: 7  Pain Location:  chest pressure Pain Descriptors / Indicators: Pressure Pain Intervention(s): Monitored during session, Limited activity within patient's tolerance, Repositioned    Home Living Family/patient expects to be discharged to:: Private residence Living  Arrangements: Spouse/significant other;Children Available Help at Discharge: Family;Available 24 hours/day Type of Home: House Home Access: Ramped entrance     Alternate Level Stairs-Number of Steps: 10 Home Layout: Multi-level Home Equipment: None      Prior Function Prior Level of Function : Independent/Modified Independent;Working/employed;Driving             Mobility Comments: pt reports no issues at baseline, no falls ADLs Comments: pt reports independence     Extremity/Trunk Assessment   Upper Extremity Assessment Upper Extremity Assessment: Defer to OT evaluation    Lower Extremity Assessment Lower Extremity Assessment: RLE deficits/detail;LLE deficits/detail RLE Deficits / Details: grossly 4+/5 to MMT, reports numbness on bottom of his foot RLE Sensation: decreased light touch LLE Deficits / Details: grossly 4+/5 to MMT, reports numbness on bottom of his foot and top of toes LLE Sensation: decreased light touch    Cervical / Trunk Assessment Cervical / Trunk Assessment: Normal  Communication   Communication Communication: No apparent difficulties    Cognition Arousal: Alert Behavior During Therapy: WFL for tasks assessed/performed   PT - Cognitive impairments: No apparent impairments                       PT - Cognition Comments: pt with flat affect but conversational. not formally assessed Following commands: Intact       Cueing Cueing Techniques: Verbal cues     General Comments General comments (skin integrity, edema, etc.): pt with onset of 7/10 chest pressure after stairs, monitor not working to determine HR, denies SOB. HR 106bpm after return to room with SpO2 100% on RA.    Exercises     Assessment/Plan    PT Assessment Patient needs continued PT services  PT Problem List Cardiopulmonary status limiting activity;Decreased activity tolerance;Decreased balance;Decreased mobility       PT Treatment Interventions DME  instruction;Gait training;Stair training;Functional mobility training;Therapeutic activities;Therapeutic exercise;Patient/family education    PT Goals (Current goals can be found in the Care Plan section)  Acute Rehab PT Goals Patient Stated Goal: return home PT Goal Formulation: With patient Time For Goal Achievement: 05/19/24 Potential to Achieve Goals: Good    Frequency Min 1X/week        AM-PAC PT 6 Clicks Mobility  Outcome Measure Help needed turning from your back to your side while in a flat bed without using bedrails?: None Help needed moving from lying on your back to sitting on the side of a flat bed without using bedrails?: None Help needed moving to and from a bed to a chair (including a wheelchair)?: None Help needed standing up from a chair using your arms (e.g., wheelchair or bedside chair)?: A Little Help needed to walk in hospital room?: A Little Help needed climbing 3-5 steps with a railing? : A Little 6 Click Score: 21    End of Session Equipment Utilized During Treatment: Gait belt Activity Tolerance: Patient limited by pain (chest pressure) Patient left: in bed;with call bell/phone within reach;Other (comment) (with OT) Nurse Communication: Mobility status;Other (comment) (chest pressure after stairs) PT Visit Diagnosis: Unsteadiness on feet (R26.81)    Time: 8463-8444 PT Time Calculation (min) (ACUTE ONLY): 19 min   Charges:   PT Evaluation $PT Eval Moderate Complexity: 1 Mod   PT General Charges $$ ACUTE  PT VISIT: 1 Visit         Izetta Call, PT, DPT   Acute Rehabilitation Department Office [phone] Secure Chat Communication Preferred  Izetta JULIANNA Call 05/05/2024, 4:04 PM

## 2024-05-05 NOTE — Evaluation (Signed)
 Occupational Therapy Evaluation Patient Details Name: Shawn Lawrence MRN: [id number] DOB: 1961-01-09 Today's Date: 05/05/2024   History of Present Illness   The pt is a 63 yo male presenting 11/33 with weakness, nausea and vomiting. Admitted for PNA and sepsis. PMH includes: HTN, DM II, HLD, OSA, obesity (BMI 38), and DISH (diffuse idiopathic skeletal hyperostosis).     Clinical Impressions Pt greeted ambulating in hall with PT staff upon OT arrival. PTA, pt was indep with ADLs, driving, and working full time as an airline pilot. Endorses typically sedentary lifestyle. He is presenting today with reduced activity tolerance but grossly intact strength. He is presenting near his baseline, mod I for UB/LB ADLs and SBA for mobility without AD. HR mildly tachy in low 100s post-mobility, otherwise VSS. Given pt is near his baseline, no further acute nor post-acute OT needs at this time. OT to sign-off.     If plan is discharge home, recommend the following:   A little help with bathing/dressing/bathroom;Assistance with cooking/housework     Functional Status Assessment         Equipment Recommendations   None recommended by OT     Recommendations for Other Services         Precautions/Restrictions   Precautions Precautions: None Restrictions Weight Bearing Restrictions Per Provider Order: No     Mobility Bed Mobility Overal bed mobility: Modified Independent             General bed mobility comments: used rail to get back into bed with HOB mostly flat    Transfers Overall transfer level: Needs assistance Equipment used: None Transfers: Sit to/from Stand Sit to Stand: Supervision           General transfer comment: ambulated household distance without AD      Balance Overall balance assessment: Mild deficits observed, not formally tested                                         ADL either performed or assessed with clinical  judgement   ADL Overall ADL's : At baseline                                       General ADL Comments: dons/doffs slide-on shoes, doffed gown with setup (assist for PIV); simulated sufficient ROM/strength to complete remainder of UB/LB ADLs; endorses some difficulty with donning socks at baseline, declined sock aide/adaptive equipment edu     Vision Patient Visual Report: No change from baseline Vision Assessment?: Wears glasses for reading     Perception         Praxis         Pertinent Vitals/Pain Pain Assessment Pain Assessment: No/denies pain Pain Score: 0-No pain     Extremity/Trunk Assessment Upper Extremity Assessment Upper Extremity Assessment: Overall WFL for tasks assessed   Lower Extremity Assessment Lower Extremity Assessment: RLE deficits/detail;LLE deficits/detail RLE Deficits / Details: grossly 4+/5 to MMT, reports numbness on bottom of his foot RLE Sensation: decreased light touch LLE Deficits / Details: grossly 4+/5 to MMT, reports numbness on bottom of his foot and top of toes LLE Sensation: decreased light touch   Cervical / Trunk Assessment Cervical / Trunk Assessment: Normal   Communication Communication Communication: No apparent difficulties   Cognition Arousal: Alert Behavior During Therapy: [facility] for  tasks assessed/performed Cognition: No apparent impairments             OT - Cognition Comments: some command following difficulties but suspect 2/2 language barrier (English not primary language)                 Following commands: Intact       Cueing  General Comments   Cueing Techniques: Verbal cues;Visual cues  HR low 100s, SpO2 100% on RA; supportive friends present towards end of session   Exercises     Shoulder Instructions      Home Living Family/patient expects to be discharged to:: Private residence Living Arrangements: Spouse/significant other;Children Available Help at Discharge:  Family;Available 24 hours/day Type of Home: House Home Access: Ramped entrance     Home Layout: Multi-level Alternate Level Stairs-Number of Steps: 10 Alternate Level Stairs-Rails: None Bathroom Shower/Tub: Chief Strategy Officer: Standard     Home Equipment: None          Prior Functioning/Environment Prior Level of Function : Independent/Modified Independent;Working/employed;Driving education officer, community)             Mobility Comments: denies falls; no AD PTA ADLs Comments: indep; works full time as an airline pilot; endorses primarily sedentary lifestyle    OT Problem List: Decreased activity tolerance;Cardiopulmonary status limiting activity   OT Treatment/Interventions:        OT Goals(Current goals can be found in the care plan section)   Acute Rehab OT Goals Patient Stated Goal: to be healthy   OT Frequency:       Co-evaluation              AM-PAC OT 6 Clicks Daily Activity     Outcome Measure Help from another person eating meals?: None Help from another person taking care of personal grooming?: None Help from another person toileting, which includes using toliet, bedpan, or urinal?: None Help from another person bathing (including washing, rinsing, drying)?: None Help from another person to put on and taking off regular upper body clothing?: None Help from another person to put on and taking off regular lower body clothing?: A Little 6 Click Score: 23   End of Session Equipment Utilized During Treatment: Gait belt Nurse Communication: Mobility status;Other (comment) (pt endorsing chest pressure with stair training with PT upon OT arrival (RN in room and aware))  Activity Tolerance: Patient tolerated treatment well Patient left: in bed;with call bell/phone within reach;with family/visitor present  OT Visit Diagnosis: Other abnormalities of gait and mobility (R26.89)                Time: 8450-8393 OT Time Calculation (min): 17  min Charges:  OT General Charges $OT Visit: 1 Visit OT Evaluation $OT Eval Low Complexity: 1 Low  Larell Baney M. [place], OTR/L [facility] Acute Rehabilitation Services [phone] Secure Chat Preferred  Rikki [place] 05/05/2024, 5:10 PM

## 2024-05-06 ENCOUNTER — Inpatient Hospital Stay (HOSPITAL_COMMUNITY)

## 2024-05-06 DIAGNOSIS — D62 Acute posthemorrhagic anemia: Secondary | ICD-10-CM

## 2024-05-06 DIAGNOSIS — A419 Sepsis, unspecified organism: Secondary | ICD-10-CM | POA: Diagnosis not present

## 2024-05-06 DIAGNOSIS — K921 Melena: Secondary | ICD-10-CM

## 2024-05-06 DIAGNOSIS — R079 Chest pain, unspecified: Secondary | ICD-10-CM | POA: Diagnosis not present

## 2024-05-06 DIAGNOSIS — J189 Pneumonia, unspecified organism: Secondary | ICD-10-CM

## 2024-05-06 LAB — CBC WITH DIFFERENTIAL/PLATELET
Abs Immature Granulocytes: 0.26 K/uL — ABNORMAL HIGH (ref 0.00–0.07)
Basophils Absolute: 0.2 K/uL — ABNORMAL HIGH (ref 0.0–0.1)
Basophils Absolute: 0.2 K/uL — ABNORMAL HIGH (ref 0.0–0.1)
Basophils Absolute: 0 K/uL (ref 0.0–0.1)
Basophils Relative: 1 %
Basophils Relative: 1 %
Basophils Relative: 0 %
Eosinophils Absolute: 0.7 K/uL — ABNORMAL HIGH (ref 0.0–0.5)
Eosinophils Absolute: 0 K/uL (ref 0.0–0.5)
Eosinophils Absolute: 0.8 K/uL — ABNORMAL HIGH (ref 0.0–0.5)
Eosinophils Relative: 4 %
Eosinophils Relative: 0 %
Eosinophils Relative: 4 %
HCT: 21.3 % — ABNORMAL LOW (ref 39.0–52.0)
HCT: 22 % — ABNORMAL LOW (ref 39.0–52.0)
HCT: 22.9 % — ABNORMAL LOW (ref 39.0–52.0)
Hemoglobin: 6.9 g/dL — CL (ref 13.0–17.0)
Hemoglobin: 7.1 g/dL — ABNORMAL LOW (ref 13.0–17.0)
Hemoglobin: 7.4 g/dL — ABNORMAL LOW (ref 13.0–17.0)
Immature Granulocytes: 2 %
Lymphocytes Relative: 37 %
Lymphocytes Relative: 31 %
Lymphocytes Relative: 16 %
Lymphs Abs: 6.5 K/uL — ABNORMAL HIGH (ref 0.7–4.0)
Lymphs Abs: 6.8 K/uL — ABNORMAL HIGH (ref 0.7–4.0)
Lymphs Abs: 3.3 K/uL (ref 0.7–4.0)
MCH: 29 pg (ref 26.0–34.0)
MCH: 29.1 pg (ref 26.0–34.0)
MCH: 29.1 pg (ref 26.0–34.0)
MCHC: 32.4 g/dL (ref 30.0–36.0)
MCHC: 32.3 g/dL (ref 30.0–36.0)
MCHC: 32.3 g/dL (ref 30.0–36.0)
MCV: 89.5 fL (ref 80.0–100.0)
MCV: 90.2 fL (ref 80.0–100.0)
MCV: 90.2 fL (ref 80.0–100.0)
Monocytes Absolute: 1 K/uL (ref 0.1–1.0)
Monocytes Absolute: 0.2 K/uL (ref 0.1–1.0)
Monocytes Absolute: 0.8 K/uL (ref 0.1–1.0)
Monocytes Relative: 6 %
Monocytes Relative: 1 %
Monocytes Relative: 4 %
Neutro Abs: 9 K/uL — ABNORMAL HIGH (ref 1.7–7.7)
Neutro Abs: 14.7 K/uL — ABNORMAL HIGH (ref 1.7–7.7)
Neutro Abs: 15.5 K/uL — ABNORMAL HIGH (ref 1.7–7.7)
Neutrophils Relative %: 50 %
Neutrophils Relative %: 67 %
Neutrophils Relative %: 76 %
Platelets: 203 K/uL (ref 150–400)
Platelets: 214 K/uL (ref 150–400)
Platelets: 213 K/uL (ref 150–400)
RBC: 2.38 MIL/uL — ABNORMAL LOW (ref 4.22–5.81)
RBC: 2.44 MIL/uL — ABNORMAL LOW (ref 4.22–5.81)
RBC: 2.54 MIL/uL — ABNORMAL LOW (ref 4.22–5.81)
RDW: 14.4 % (ref 11.5–15.5)
RDW: 14.2 % (ref 11.5–15.5)
RDW: 14.6 % (ref 11.5–15.5)
WBC: 17.5 K/uL — ABNORMAL HIGH (ref 4.0–10.5)
WBC: 22 K/uL — ABNORMAL HIGH (ref 4.0–10.5)
WBC: 20.4 K/uL — ABNORMAL HIGH (ref 4.0–10.5)
nRBC: 0.3 % — ABNORMAL HIGH (ref 0.0–0.2)
nRBC: 0.2 % (ref 0.0–0.2)
nRBC: 0.7 % — ABNORMAL HIGH (ref 0.0–0.2)

## 2024-05-06 LAB — ECHOCARDIOGRAM COMPLETE
Area-P 1/2: 3.68 cm2
Height: 72 in
S' Lateral: 2.8 cm
Weight: 4571.46 [oz_av]

## 2024-05-06 LAB — PREPARE RBC (CROSSMATCH)

## 2024-05-06 LAB — GLUCOSE, CAPILLARY
Glucose-Capillary: 154 mg/dL — ABNORMAL HIGH (ref 70–99)
Glucose-Capillary: 164 mg/dL — ABNORMAL HIGH (ref 70–99)
Glucose-Capillary: 151 mg/dL — ABNORMAL HIGH (ref 70–99)
Glucose-Capillary: 164 mg/dL — ABNORMAL HIGH (ref 70–99)

## 2024-05-06 LAB — CBC
HCT: 22.5 % — ABNORMAL LOW (ref 39.0–52.0)
Hemoglobin: 7.4 g/dL — ABNORMAL LOW (ref 13.0–17.0)
MCH: 29.5 pg (ref 26.0–34.0)
MCHC: 32.9 g/dL (ref 30.0–36.0)
MCV: 89.6 fL (ref 80.0–100.0)
Platelets: 186 K/uL (ref 150–400)
RBC: 2.51 MIL/uL — ABNORMAL LOW (ref 4.22–5.81)
RDW: 14.4 % (ref 11.5–15.5)
WBC: 18.2 K/uL — ABNORMAL HIGH (ref 4.0–10.5)
nRBC: 0.5 % — ABNORMAL HIGH (ref 0.0–0.2)

## 2024-05-06 LAB — IRON AND TIBC
Iron: 85 ug/dL (ref 45–182)
Saturation Ratios: 34 % (ref 17.9–39.5)
TIBC: 249 ug/dL — ABNORMAL LOW (ref 250–450)
UIBC: 164 ug/dL

## 2024-05-06 LAB — BASIC METABOLIC PANEL WITH GFR
Anion gap: 7 (ref 5–15)
BUN: 30 mg/dL — ABNORMAL HIGH (ref 8–23)
CO2: 21 mmol/L — ABNORMAL LOW (ref 22–32)
Calcium: 8 mg/dL — ABNORMAL LOW (ref 8.9–10.3)
Chloride: 113 mmol/L — ABNORMAL HIGH (ref 98–111)
Creatinine, Ser: 0.63 mg/dL (ref 0.61–1.24)
GFR, Estimated: >60 mL/min (ref >60)
Glucose, Bld: 159 mg/dL — ABNORMAL HIGH (ref 70–99)
Potassium: 4.1 mmol/L (ref 3.5–5.1)
Sodium: 141 mmol/L (ref 135–145)

## 2024-05-06 LAB — TROPONIN I (HIGH SENSITIVITY): Troponin I (High Sensitivity): 8 ng/L (ref <18)

## 2024-05-06 LAB — FERRITIN: Ferritin: 115 ng/mL (ref 24–336)

## 2024-05-06 LAB — RETICULOCYTES
Immature Retic Fract: 37.7 % — ABNORMAL HIGH (ref 2.3–15.9)
RBC.: 2.4 MIL/uL — ABNORMAL LOW (ref 4.22–5.81)
Retic Count, Absolute: 105.4 K/uL (ref 19.0–186.0)
Retic Ct Pct: 4.4 % — ABNORMAL HIGH (ref 0.4–3.1)

## 2024-05-06 LAB — ABO/RH: ABO/RH(D): AB POS

## 2024-05-06 LAB — VITAMIN B12: Vitamin B-12: 225 pg/mL (ref 180–914)

## 2024-05-06 LAB — FOLATE: Folate: 6.8 ng/mL (ref >5.9)

## 2024-05-06 LAB — PATHOLOGIST SMEAR REVIEW

## 2024-05-06 MED ORDER — PANTOPRAZOLE SODIUM 40 MG IV SOLR
40.0000 mg | Freq: Two times a day (BID) | INTRAVENOUS | Status: DC
  Administered 2024-05-06 – 2024-05-08 (×5): 40 mg via INTRAVENOUS
  Filled 2024-05-06 (×5): qty 10

## 2024-05-06 MED ORDER — PERFLUTREN LIPID MICROSPHERE
1.0000 mL | INTRAVENOUS | Status: AC | PRN
  Administered 2024-05-06: 4 mL via INTRAVENOUS

## 2024-05-06 MED ORDER — SODIUM CHLORIDE 0.9 % IV SOLN
INTRAVENOUS | Status: DC
  Administered 2024-05-06: 20 mL via INTRAVENOUS

## 2024-05-06 MED ORDER — INSULIN ASPART 100 UNIT/ML IJ SOLN
0.0000 [IU] | INTRAMUSCULAR | Status: DC
  Administered 2024-05-06 – 2024-05-08 (×6): 1 [IU] via SUBCUTANEOUS
  Administered 2024-05-08: 2 [IU] via SUBCUTANEOUS
  Administered 2024-05-08 – 2024-05-09 (×3): 1 [IU] via SUBCUTANEOUS
  Filled 2024-05-06 (×10): qty 1

## 2024-05-06 MED ORDER — RIVAROXABAN 10 MG PO TABS: 10.0000 mg | ORAL_TABLET | Freq: Every day | ORAL | Status: DC

## 2024-05-06 MED ORDER — SODIUM CHLORIDE 0.9% IV SOLUTION: Freq: Once | INTRAVENOUS | Status: AC

## 2024-05-06 MED ORDER — INSULIN ASPART 100 UNIT/ML IJ SOLN
0.0000 [IU] | INTRAMUSCULAR | Status: DC
  Administered 2024-05-06 (×2): 1 [IU] via SUBCUTANEOUS
  Filled 2024-05-06: qty 1

## 2024-05-06 MED ORDER — INSULIN ASPART 100 UNIT/ML IJ SOLN: 0.0000 [IU] | Freq: Every day | INTRAMUSCULAR | Status: DC

## 2024-05-06 NOTE — Progress Notes (Signed)
   05/06/24 9665  Provider Notification  Provider Name/Title Dr. Franky MD  Date Provider Notified 05/06/24  Time Provider Notified [phone]  Method of Notification Page  Notification Reason Critical Result  Test performed and critical result HgB 6.9  Date Critical Result Received 05/06/24  Time Critical Result Received 0332  Provider response See new orders  Date of Provider Response 05/06/24  Time of Provider Response [phone]   Updated MD

## 2024-05-06 NOTE — Progress Notes (Signed)
 PT Cancellation Note  Patient Details Name: Shawn Lawrence MRN: [id number] DOB: 07/31/1960   Cancelled Treatment:    Reason Eval/Treat Not Completed: Medical issues which prohibited therapy. Hold per MD request due new onset of rectal bleeding and chest pain. Plan to follow up when appropriate.    Leontine Hilt DPT Acute Rehab Services [phone] Prefer contact via chat   Leontine NOVAK [name] 05/06/2024, 11:16 AM

## 2024-05-06 NOTE — Progress Notes (Incomplete Revision)
   05/06/24 9665  Provider Notification  Provider Name/Title Dr. Franky MD  Date Provider Notified 05/06/24  Time Provider Notified [phone]  Method of Notification Page  Notification Reason Critical Result  Test performed and critical result HgB 6.9  Date Critical Result Received 05/06/24  Time Critical Result Received 0332  Provider response See new orders  Date of Provider Response 05/06/24  Time of Provider Response [phone]   Updated MD

## 2024-05-06 NOTE — Consult Note (Addendum)
 Consultation Note   Referring Provider:   [facility] PCP: Sim Emery CROME, MD Primary Gastroenterologist:   [facility]      Reason for Consultation:  GI Bleed, anemia DOA: 05/04/2024         Hospital Day: 3   ASSESSMENT    63 yo male from [place] admitted with septic shock secondary to CAP Getting IV antibiotics, still with significant leukocytosis but afebrile.  No hypoxia.  Lactic acidosis resolved.   Burns anemia ( new) Reported black stool ( x 2 days) Iron  studies not consistent with iron  deficiency Presenting hgb 12.7 , down from 15.8 in 2021 ( no interval labs). Further decline in hgb since admission to 7.1 however some of the drop may be dilutional after IV fluid bolus.   Chest pain Transient chest pain this am. Normal high-sensitivity troponin. CT angio negative for PE. EKG shows left atrial enlargement, LVH and possible mild ST elevation .  Awaiting echocardiogram  Ascending thoracic aortic aneurysm 4 cm incised on CTA chest this admission  Hypertension  Type 2 Diabetes  Hepatic steatosis Normal liver enzymes  Diffuse idiopathic skeletal hyperostosis (DISH) Chronic back pain  See PMH for any additional medical history  / medical problems  Principal Problem:   Sepsis due to pneumonia (HCC) Active Problems:   OSA (obstructive sleep apnea)   Hypotension due to hypovolemia   Type 2 diabetes mellitus with hyperglycemia, without long-term current use of insulin  (HCC)   Obesity, Class II, BMI 35-39.9   Chronic bilateral low back pain   Pneumonia of both lower lobes due to infectious organism    PLAN:   --For echocardiogram later today --Continue twice daily IV pantoprazole  -- Monitor H&H closely.  He received a unit of RBCs this a.m. for a hemoglobin of 7.1, posttransfusion H&H not yet resulted.  -- Eventual EGD when medically stable.  N.p.o. for now (only 12 hours since last dark BM)   HPI    Brief History:   Patient  admitted 11/22  ago with weakness , diaphoresis, . WBC elevated, lactic acid up, he was hypotensive and briefly required pressors. Chest imaging showed patchy bibasilar ground glass airspace disease . He was admitted with sepsis 2/2 to pneumonia.  Last CBC was back in 2021 at which time his hemoglobin was 15.8.  No interval labs.  Presenting hemoglobin this admission 12.7.  Patient has been improving from a sepsis standpoint   Interval History:  Overnight patient's hgb declined. He has been having very dark stools for 2 days, last one was approximately 12 hours ago.  No nausea nor vomiting. He gives a history of chronic, intermittent epigastric discomfort.  He tells me the discomfort is better when he fasts and drinks only water.  Other times food can relieve the pain .  He definitely had an exacerbation of the pain when he took metformin  for a week several months ago .  He takes Tums as needed which is a significant help. He adamantly denies use of NSAIDs .  He has no prior history of PUD.  He has  never had a colonoscopy.  No known family history of colon cancer.  Earlier this morning while getting up to the restroom he had  some distal lower chest pain, now resolved.   Admission labs /imaging notable for: BUN 27 /normal creatinine, low I.Ca of 0.81, WBC 21K, hemoglobin 10.4, MCV 87, ferritin 115, TIBC 249, iron  saturation 34%, serum iron  85  CTAP with contrast Hepatic steatosis, gallbladder unremarkable, pancreas unremarkable, scattered colonic diverticulosis, no bowel obstruction, no bowel wall thickening or inflammatory changes, 4 cm ascending thoracic aortic aneurysm, patchy bibasilar ground glass densities in lower lobe bronchial wall thickening compatible with inflammation, infection or aspiration   Pertinent GI Studies   none  Labs and Imaging:  Recent Labs    05/04/24 1345  PROT 7.3  ALBUMIN 2.7*  AST 24  ALT 17  ALKPHOS 65  BILITOT 0.9  BILIDIR  0.2  IBILI 0.7   Recent Labs    05/05/24 0111 05/06/24 0207 05/06/24 0437  WBC 21.2* 17.5* 22.0*  HGB 10.4* 6.9* 7.1*  HCT 32.7* 21.3* 22.0*  MCV 87.7 89.5 90.2  PLT 260 203 214   Recent Labs    05/04/24 1345 05/04/24 1358 05/05/24 0111 05/06/24 0207  NA 138 139 142 141  K 4.3 4.2 4.2 4.1  CL 108 113* 111 113*  CO2 25  --  28 21*  GLUCOSE 234* 264* 130* 159*  BUN 27* 31* 29* 30*  CREATININE 0.95 0.80 0.88 0.63  CALCIUM  8.6*  --  8.7* 8.0*     CT Head Wo Contrast CLINICAL DATA:  New onset headache  EXAM: CT HEAD WITHOUT CONTRAST  TECHNIQUE: Contiguous axial images were obtained from the base of the skull through the vertex without intravenous contrast.  RADIATION DOSE REDUCTION: This exam was performed according to the departmental dose-optimization program which includes automated exposure control, adjustment of the mA and/or kV according to patient size and/or use of iterative reconstruction technique.  COMPARISON:  03/02/2018  FINDINGS: Brain: No acute infarct or hemorrhage. Lateral ventricles and midline structures are stable, with a 6 mm colloid cyst again noted. No acute extra-axial fluid collections. No mass effect.  Vascular: No hyperdense vessel or unexpected calcification.  Skull: Normal. Negative for fracture or focal lesion.  Sinuses/Orbits: No acute finding.  Other: None.  IMPRESSION: 1. No acute intracranial process. 2. 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  Electronically Signed   By: Ozell Daring M.D.   On: 05/04/2024 15:12   Past Medical History:  Diagnosis Date   Abscess 10/2004 s/p I and D   peritonsilar   Gout    no aspiration   Hypertension    Obesity    Schistosomiasis 03/03/00    in urine    Past Surgical History:  Procedure Laterality Date   cataract      Family History  Problem Relation Age of Onset   Hypertension Mother     Heart disease Mother 74       died of MI   Diabetes Father    Kidney disease Father    Hypertension Brother    Hypertension Brother     Prior to Admission medications   Medication Sig Start Date End Date Taking? Authorizing Provider  acetaminophen  (TYLENOL ) 500 MG tablet Take 500-1,000 mg by mouth every 6 (six) hours as needed for moderate pain (pain score 4-6).   Yes [provider]  amLODipine  (NORVASC ) 10 MG tablet Take 1 tablet (10 mg total) by mouth daily. 03/19/24  Yes   carvedilol  (COREG ) 6.25 MG tablet Take 1 tablet (6.25 mg total)  by mouth 2 (two) times daily. 03/19/24  Yes   cloNIDine  (CATAPRES ) 0.1 MG tablet Take 1 tablet (0.1 mg total) by mouth 2 (two) times daily. 03/19/24  Yes   lisinopril  (ZESTRIL ) 40 MG tablet Take 1 tablet (40 mg total) by mouth daily. 03/19/24  Yes   metFORMIN  (GLUCOPHAGE ) 500 MG tablet Take 1 tablet (500 mg total) by mouth 2 (two) times daily. Patient not taking: Reported on 05/05/2024 12/12/23   Sim Emery CROME, MD    Current Facility-Administered Medications  Medication Dose Route Frequency Provider Last Rate Last Admin   acetaminophen  (TYLENOL ) tablet 1,000 mg  1,000 mg Oral TID Lou Claretta HERO, MD   1,000 mg at 05/06/24 1015   azithromycin  (ZITHROMAX ) tablet 500 mg  500 mg Oral Daily Lou Claretta HERO, MD   500 mg at 05/06/24 1016   bisacodyl  (DULCOLAX) EC tablet 5 mg  5 mg Oral Daily PRN Lou Claretta HERO, MD       cefTRIAXone  (ROCEPHIN ) 1 g in sodium chloride  0.9 % 100 mL IVPB  1 g Intravenous Q24H Lou Claretta HERO, MD 200 mL/hr at 05/05/24 1615 1 g at 05/05/24 1615   insulin  aspart (novoLOG ) injection 0-5 Units  0-5 Units Subcutaneous QHS Franky Redia SAILOR, MD       insulin  aspart (novoLOG ) injection 0-6 Units  0-6 Units Subcutaneous Q4H Franky Redia SAILOR, MD   1 Units at 05/06/24 9381   lidocaine  (LIDODERM ) 5 % 1 patch  1 patch Transdermal Q24H Lou Claretta HERO, MD   1 patch at 05/04/24 2141   ondansetron  (ZOFRAN )  tablet 4 mg  4 mg Oral Q6H PRN Lou Claretta HERO, MD       Or   ondansetron  (ZOFRAN ) injection 4 mg  4 mg Intravenous Q6H PRN Lou Claretta HERO, MD       pantoprazole  (PROTONIX ) injection 40 mg  40 mg Intravenous Q12H Franky Redia SAILOR, MD   40 mg at 05/06/24 1016    Allergies as of 05/04/2024 - Review Complete 05/04/2024  Allergen Reaction Noted   Hctz [hydrochlorothiazide] Other (See Comments) 08/30/2010    Social History   Socioeconomic History   Marital status: Married    Spouse name: Not on file   Number of children: Not on file   Years of education: Not on file   Highest education level: Not on file  Occupational History   Not on file  Tobacco Use   Smoking status: Never   Smokeless tobacco: Never  Vaping Use   Vaping status: Never Used  Substance and Sexual Activity   Alcohol use: No   Drug use: No   Sexual activity: Not on file  Other Topics Concern   Not on file  Social History Narrative   Occuaption: accounting, full-time   Married   Most of family has HTN   Social Drivers of Corporate Investment Banker Strain: Not on file  Food Insecurity: Not on file  Transportation Needs: Not on file  Physical Activity: Not on file  Stress: Not on file  Social Connections: Not on file  Intimate Partner Violence: Not on file     Code Status   Code Status: Full Code  Review of Systems: All systems reviewed and negative except where noted in HPI.  Physical Exam: Vital signs in last 24 hours: Temp:  [98.1 F (36.7 C)-98.7 F (37.1 C)] 98.5 F (36.9 C) (11/24 0838) Pulse Rate:  [89-104] 102 (11/24 0838) Resp:  [14-18] 18 (11/24 0838) BP: (  111-164)/(60-93) 140/82 (11/24 0838) SpO2:  [97 %-100 %] 98 % (11/24 0838) Last BM Date : 05/06/24  General:  Pleasant male in NAD Psych:  Cooperative. Normal mood and affect Eyes: Pupils equal Ears:  Normal auditory acuity Nose: No deformity, discharge or lesions Neck:  Supple, no masses felt Lungs:  Clear to  auscultation.  Heart:  Regular rate, regular rhythm.  Abdomen:  Soft, nondistended, nontender, active bowel sounds, no masses felt Rectal :  Deferred Msk: Symmetrical without gross deformities.  Neurologic:  Alert, oriented, grossly normal neurologically Skin:  Intact without significant lesions.    Intake/Output from previous day: 11/23 0701 - 11/24 0700 In: 2921.8 [P.O.:840; I.V.:1881.8; IV Piggyback:200] Out: 600 [Urine:600] Intake/Output this shift:  Total I/O In: 518.1 [Blood:518.1] Out: -    Vina Dasen, NP-C   05/06/2024, 10:21 AM  GI ATTENDING  History, laboratories, x-rays all personally reviewed.  Patient seen and examined as outlined above.  Agree with comprehensive consultation note as outlined above.  Alois presents with community-acquired pneumonia.  Noted to have drifting hemoglobin and a history of dark stools.  Currently stabilizing from his pneumonia.  Agree that an upper endoscopy when clinically appropriate would be in order.  He is higher than baseline risk due to his current acute medical status.  In the interim, continue PPI and follow-up posttransfusion hemoglobin.  Transfuse as needed.  Will follow. A total time of 75 minutes was spent preparing to see the patient, reviewing records, obtaining comprehensive history, performing medically appropriate physical exam, counseling and educating the patient regarding above listed issues, planning for endoscopy, and documenting clinical information in the health record  Solomiya Pascale N. Abran Raddle., M.D. [facility] Division of Gastroenterology

## 2024-05-06 NOTE — Progress Notes (Signed)
 PROGRESS NOTE    DARRIAN GRZELAK  FMW:[id number] DOB: 1961/05/29 DOA: 05/04/2024 PCP: Sim Emery CROME, MD   Brief Narrative:  Shawn Lawrence is a 63 y.o. male with medical history significant for HTN, T2DM, HLD, OSA, obesity and DISH (diffuse idiopathic skeletal hyperostosis) who presented to the ED for evaluation of generalized weakness and diaphoresis. Patient reports he he was in his usual state of health until 2 days ago when he started feeling unwell with some nausea.  While at work 05/04/2024, he felt tired and decided to take a nap. When he woke up, he was significantly weak and diaphoretic so his coworkers called EMS for evaluation.  He reports a chronic low back pain that has been slightly worse for the last few days and poor appetite today but denies any cough, shortness of breath, fever, chills, headache, dizziness, abdominal pain, vomiting, dysuria, constipation, diarrhea or chest pain.   ED Course: Initial vitals show temp 97.4, RR 17-24, HR 80-90s, hypotensive with SBP in the 80s, requiring Levophed  briefly, SpO2 100% on room air. Initial labs significant for WBC 19.4, Hgb 12.7, glucose 234, calcium  8.6, lactic acid 2.8, normal troponin, normal renal function and LFTs, UA with no signs of infection. EKG shows sinus rhythm with LVH and nonspecific ST changes.  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  CTA chest/abdomen/pelvis negative for PE, dissection, aneurysm but shows patchy bibasilar ground glass airspace disease and lower lobe bronchial wall thickening compatible with inflammation, infection or aspiration.  Required Levophed  up to 10 mcg for 3 hours.  He received IV LR 1 L bolus, IV fentanyl , IV calcium  gluconate, IV Zofran , IV azithromycin  and IV Rocephin . TRH was consulted for admission.   Assessment & Plan:   Principal Problem:   Sepsis due to pneumonia [facility]) Active Problems:   OSA (obstructive sleep apnea)    Hypotension due to hypovolemia   Type 2 diabetes mellitus with hyperglycemia, without long-term current use of insulin  (HCC)   Obesity, Class II, BMI 35-39.9   Chronic bilateral low back pain   Pneumonia of both lower lobes due to infectious organism  Septic shock secondary to community-acquired pneumonia, POA: Patient met criteria for septic shock based on tachypnea, leukocytosis, lactic acidosis of 2.8, hypotension requiring vasopressors for approximately 3 hours and evidence of pneumonia.  Blood pressure improved but now on the low normal side.  Holding antihypertensives.  Cultures and urine antigen for Legionella and streptococci negative.  On Rocephin  and Zithromax  which we will continue.  Leukocytosis slightly worsened but he is afebrile without hypoxia.  Lactic acidosis resolved.  Due to borderline low blood pressure and being n.p.o., I will resume IV fluids.  Acute blood loss anemia secondary to lower GI bleed, not POA: Surprisingly, patient's hemoglobin dropped from 4.7 upon arrival to 6.9 overnight.  Repeat was 7.1.  1 unit of PRBC transfusion was ordered and completed.  Prior to this, patient did not have any signs of bleeding however he was noted to have 2 episodes of bright red blood per rectum.  Initial CT scan did show diverticulosis but no diverticulitis.  Likely diverticular bleed.  GI has been consulted.  Will repeat H&H posttransfusion and monitor every 8 hours.  Chest pain: While working with PT on 05/05/2024, patient was noted to have 7 out of 10 chest pressure along with diaphoresis which improved with rest.  He did not have any chest pain complaint prior to this.  When he came in,  troponin x 2 were negative, CT angio ruled out PE.  EKG shows left atrial enlargement, LVH and possible mild ST elevation.  No further chest pain complaint.  Will repeat EKG and troponin.  Also order echo.  He denied any chest pain today.   Hx of hypertension - Will hold BP meds for today and monitor  for now.   T2DM with hyperglycemia - No recent A1c on file, blood sugar elevated to the 200s on admission - Reports he was prescribed metformin  by his PCP but discontinued on due to GI side effects.  Hemoglobin A1c 7.5.  Currently on SSI.   Hx of DISH ((diffuse idiopathic skeletal hyperostosis)  Chronic low back pain - Multimodal pain control with scheduled Tylenol  and lidocaine  patch   OSA - Not on CPAP   Generalized weakness In the setting of acute illness, PT OT evaluated, recommended cardiac rehab.   Class II obesity Body mass index is 37.97 kg/m.  Ascending thoracic aortic aneurysm: 4 cm ascending thoracic aortic aneurysm. Recommend annual imaging followup by CTA or MRA. This recommendation follows 2010 ACCF/AHA/AATS/ACR/ASA/SCA/SCAI/SIR/STS/SVM Guidelines for the Diagnosis and Management of Patients with Thoracic Aortic Disease. Circulation. 2010; 121: Z733-z630. Aortic aneurysm NOS (ICD10-I71.9)  Morbid obesity class III: Weight loss and diet modification counseled.  DVT prophylaxis: Place and maintain sequential compression device Start: 05/06/24 0346Lovenox   Code Status: Full Code  Family Communication: Multiple family members including wife and friends present at bedside.  Plan of care discussed with patient in length and he/she verbalized understanding and agreed with it.  Status is: Inpatient Remains inpatient appropriate because: Still feels weak, PT OT assessment pending, due to requiring vasopressors yesterday, recommend observing overnight and making sure he is fully stable prior to potentially discharging tomorrow.   Estimated body mass index is 38.75 kg/m as calculated from the following:   Height as of this encounter: 6' (1.829 m).   Weight as of this encounter: 129.6 kg.    Nutritional Assessment: Body mass index is 38.75 kg/m.[name] Seen by dietician.  I agree with the assessment and plan as outlined below: Nutrition Status:        . Skin  Assessment: I have examined the patient's skin and I agree with the wound assessment as performed by the wound care RN as outlined below:    Consultants:  GI  Procedures:  None  Antimicrobials:  Anti-infectives (From admission, onward)    Start     Dose/Rate Route Frequency Ordered Stop   05/05/24 1600  cefTRIAXone  (ROCEPHIN ) 1 g in sodium chloride  0.9 % 100 mL IVPB        1 g 200 mL/hr over 30 Minutes Intravenous Every 24 hours 05/04/24 1947 05/09/24 1559   05/05/24 1000  azithromycin  (ZITHROMAX ) tablet 500 mg        500 mg Oral Daily 05/04/24 1947 05/09/24 0959   05/04/24 1545  cefTRIAXone  (ROCEPHIN ) 1 g in sodium chloride  0.9 % 100 mL IVPB        1 g 200 mL/hr over 30 Minutes Intravenous  Once 05/04/24 1541 05/04/24 1732   05/04/24 1545  azithromycin  (ZITHROMAX ) 500 mg in sodium chloride  0.9 % 250 mL IVPB        500 mg 250 mL/hr over 60 Minutes Intravenous  Once 05/04/24 1541 05/04/24 1908         Subjective: Patient seen and examined, wife and multiple friends present at the bedside.  Patient feels better than yesterday.  Last bright red blood per rectum  was around 11 PM last night.  Denies any nausea or abdominal pain.  Objective: Vitals:   05/06/24 0557 05/06/24 0613 05/06/24 0621 05/06/24 0814  BP: (!) 136/93 (!) 152/91 137/84 111/60  Pulse: (!) 101 (!) 104  (!) 101  Resp: 18 18  18   Temp: 98.1 F (36.7 C) 98.3 F (36.8 C)  98.3 F (36.8 C)  TempSrc: Oral Oral  Oral  SpO2: 100% 100% 99% 97%  Weight:      Height:        Intake/Output Summary (Last 24 hours) at 05/06/2024 0816 Last data filed at 05/06/2024 0400 Gross per 24 hour  Intake 2921.76 ml  Output 400 ml  Net 2521.76 ml   Filed Weights   05/04/24 1344 05/04/24 1935  Weight: 127 kg 129.6 kg    Examination:  General exam: Appears calm and comfortable, morbidly obese Respiratory system: Clear to auscultation. Respiratory effort normal. Cardiovascular system: S1 & S2 heard, RRR. No JVD,  murmurs, rubs, gallops or clicks. No pedal edema. Gastrointestinal system: Abdomen is nondistended, soft and nontender. No organomegaly or masses felt. Normal bowel sounds heard. Central nervous system: Alert and oriented. No focal neurological deficits. Extremities: Symmetric 5 x 5 power. Skin: No rashes, lesions or ulcers.  Psychiatry: Judgement and insight appear normal. Mood & affect appropriate.    Data Reviewed: I have personally reviewed following labs and imaging studies  CBC: Recent Labs  Lab 05/04/24 1345 05/04/24 1358 05/05/24 0111 05/06/24 0207 05/06/24 0437  WBC 19.4*  --  21.2* 17.5* 22.0*  NEUTROABS 7.4  --   --  9.0* 14.7*  HGB 12.7* 12.6* 10.4* 6.9* 7.1*  HCT 38.5* 37.0* 32.7* 21.3* 22.0*  MCV 87.1  --  87.7 89.5 90.2  PLT 348  --  260 203 214   Basic Metabolic Panel: Recent Labs  Lab 05/04/24 1345 05/04/24 1358 05/05/24 0111 05/06/24 0207  NA 138 139 142 141  K 4.3 4.2 4.2 4.1  CL 108 113* 111 113*  CO2 25  --  28 21*  GLUCOSE 234* 264* 130* 159*  BUN 27* 31* 29* 30*  CREATININE 0.95 0.80 0.88 0.63  CALCIUM  8.6*  --  8.7* 8.0*  MG 1.9  --   --   --    GFR: Estimated Creatinine Clearance: 131.5 mL/min (by C-G formula based on SCr of 0.63 mg/dL). Liver Function Tests: Recent Labs  Lab 05/04/24 1345  AST 24  ALT 17  ALKPHOS 65  BILITOT 0.9  PROT 7.3  ALBUMIN 2.7*   Recent Labs  Lab 05/04/24 1345  LIPASE 31   No results for input(s): AMMONIA in the last 168 hours. Coagulation Profile: No results for input(s): INR, PROTIME in the last 168 hours. Cardiac Enzymes: No results for input(s): CKTOTAL, CKMB, CKMBINDEX, TROPONINI in the last 168 hours. BNP (last 3 results) No results for input(s): PROBNP in the last 8760 hours. HbA1C: Recent Labs    05/04/24 2023  HGBA1C 7.5*   CBG: Recent Labs  Lab 05/04/24 2125 05/05/24 0611 05/05/24 1111 05/05/24 1654 05/05/24 2123  GLUCAP 187* 149* 180* 128* 170*   Lipid  Profile: No results for input(s): CHOL, HDL, LDLCALC, TRIG, CHOLHDL, LDLDIRECT in the last 72 hours. Thyroid Function Tests: Recent Labs    05/04/24 2023  TSH 0.339*   Anemia Panel: Recent Labs    05/06/24 0437  VITAMINB12 225  FOLATE 6.8  FERRITIN 115  TIBC 249*  IRON  85  RETICCTPCT 4.4*   Sepsis Labs: Recent  Labs  Lab 05/04/24 1653 05/05/24 0111 05/05/24 1040  PROCALCITON  --  0.11  --   LATICACIDVEN 2.8*  --  1.4    Recent Results (from the past 240 hours)  Blood culture (routine x 2)     Status: None (Preliminary result)   Collection Time: 05/04/24  3:41 PM   Specimen: BLOOD  Result Value Ref Range Status   Specimen Description BLOOD LEFT ANTECUBITAL  Final   Special Requests   Final    BOTTLES DRAWN AEROBIC AND ANAEROBIC Blood Culture results may not be optimal due to an inadequate volume of blood received in culture bottles   Culture   Final    NO GROWTH 2 DAYS Performed at [facility], [address] [place], [place] [address]    Report Status PENDING  Incomplete  Blood culture (routine x 2)     Status: None (Preliminary result)   Collection Time: 05/04/24  3:46 PM   Specimen: BLOOD  Result Value Ref Range Status   Specimen Description BLOOD RIGHT ANTECUBITAL  Final   Special Requests   Final    BOTTLES DRAWN AEROBIC AND ANAEROBIC Blood Culture adequate volume   Culture   Final    NO GROWTH 2 DAYS Performed at [facility], [address] [place], [place] [address]    Report Status PENDING  Incomplete  Respiratory (~20 pathogens) panel by PCR     Status: None   Collection Time: 05/04/24  7:22 PM   Specimen: Anterior Nasal Swab; Respiratory  Result Value Ref Range Status   Adenovirus NOT DETECTED NOT DETECTED Final   Coronavirus 229E NOT DETECTED NOT DETECTED Final    Comment: (NOTE) The Coronavirus on the Respiratory Panel, DOES NOT test for the novel  Coronavirus (2019 nCoV)    Coronavirus HKU1 NOT DETECTED NOT  DETECTED Final   Coronavirus NL63 NOT DETECTED NOT DETECTED Final   Coronavirus OC43 NOT DETECTED NOT DETECTED Final   Metapneumovirus NOT DETECTED NOT DETECTED Final   Rhinovirus / Enterovirus NOT DETECTED NOT DETECTED Final   Influenza A NOT DETECTED NOT DETECTED Final   Influenza B NOT DETECTED NOT DETECTED Final   Parainfluenza Virus 1 NOT DETECTED NOT DETECTED Final   Parainfluenza Virus 2 NOT DETECTED NOT DETECTED Final   Parainfluenza Virus 3 NOT DETECTED NOT DETECTED Final   Parainfluenza Virus 4 NOT DETECTED NOT DETECTED Final   Respiratory Syncytial Virus NOT DETECTED NOT DETECTED Final   Bordetella pertussis NOT DETECTED NOT DETECTED Final   Bordetella Parapertussis NOT DETECTED NOT DETECTED Final   Chlamydophila pneumoniae NOT DETECTED NOT DETECTED Final   Mycoplasma pneumoniae NOT DETECTED NOT DETECTED Final    Comment: Performed at [facility], [address] [place], [place] [address]  SARS Coronavirus 2 by RT PCR (hospital order, performed in [facility] hospital lab) *cepheid single result test*     Status: None   Collection Time: 05/04/24  7:22 PM  Result Value Ref Range Status   SARS Coronavirus 2 by RT PCR NEGATIVE NEGATIVE Final    Comment: Performed at [facility], [address], [place], [place] [address]     Radiology Studies: CT Head Wo Contrast Result Date: 05/04/2024 CLINICAL DATA:  New onset headache EXAM: CT HEAD WITHOUT CONTRAST TECHNIQUE: Contiguous axial images were obtained from the base of the skull through the vertex without intravenous contrast. RADIATION DOSE REDUCTION: This exam was performed according to the departmental dose-optimization program which  includes automated exposure control, adjustment of the mA and/or kV according to patient size and/or use of iterative reconstruction technique. COMPARISON:  03/02/2018 FINDINGS: Brain: No acute infarct or hemorrhage. Lateral ventricles and midline structures are stable, with a 6 mm  colloid cyst again noted. No acute extra-axial fluid collections. No mass effect. Vascular: No hyperdense vessel or unexpected calcification. Skull: Normal. Negative for fracture or focal lesion. Sinuses/Orbits: No acute finding. Other: None. IMPRESSION: 1. No acute intracranial process. 2. 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 Electronically Signed   By: Ozell Daring M.D.   On: 05/04/2024 15:12   CT Angio Chest/Abd/Pel for Dissection W and/or Wo Contrast Result Date: 05/04/2024 CLINICAL DATA:  Acute aortic syndrome, weakness, headache EXAM: CT ANGIOGRAPHY CHEST, ABDOMEN AND PELVIS TECHNIQUE: Non-contrast CT of the chest was initially obtained. Multidetector CT imaging through the chest, abdomen and pelvis was performed using the standard protocol during bolus administration of intravenous contrast. Multiplanar reconstructed images and MIPs were obtained and reviewed to evaluate the vascular anatomy. RADIATION DOSE REDUCTION: This exam was performed according to the departmental dose-optimization program which includes automated exposure control, adjustment of the mA and/or kV according to patient size and/or use of iterative reconstruction technique. CONTRAST:  OMNIPAQUE  IOHEXOL  350 MG/ML SOLN COMPARISON:  05/10/2020, 05/04/2024 FINDINGS: CTA CHEST FINDINGS Cardiovascular: There is a 4 cm ascending thoracic aortic aneurysm. No evidence of thoracic aortic dissection. The heart is unremarkable without pericardial effusion. Prominent atherosclerosis within the LAD distribution of the coronary vasculature. There is technically adequate opacification of the pulmonary vasculature. No filling defects or pulmonary emboli. Mediastinum/Nodes: No enlarged mediastinal, hilar, or axillary lymph nodes. Thyroid gland, trachea, and esophagus demonstrate no significant findings. Lungs/Pleura: There is patchy ground-glass airspace disease  within the dependent lower lobes, left greater than right, with mild lower lobe bronchial wall thickening. This may be related to infection, inflammation, or aspiration. No effusion or pneumothorax. Musculoskeletal: No acute or destructive bony abnormalities. Reconstructed images demonstrate no additional findings. Review of the MIP images confirms the above findings. CTA ABDOMEN AND PELVIS FINDINGS VASCULAR Aorta: Normal caliber aorta without aneurysm, dissection, vasculitis or significant stenosis. Celiac: Patent without evidence of aneurysm, dissection, vasculitis or significant stenosis. SMA: Patent without evidence of aneurysm, dissection, vasculitis or significant stenosis. Renals: Both renal arteries are patent without evidence of aneurysm, dissection, vasculitis, fibromuscular dysplasia or significant stenosis. IMA: Patent without evidence of aneurysm, dissection, vasculitis or significant stenosis. Inflow: Patent without evidence of aneurysm, dissection, vasculitis or significant stenosis. Minimal atherosclerosis. Veins: No obvious venous abnormality within the limitations of this arterial phase study. Review of the MIP images confirms the above findings. NON-VASCULAR Hepatobiliary: Hepatic steatosis. No focal liver abnormality. The gallbladder is unremarkable. Pancreas: Unremarkable. No pancreatic ductal dilatation or surrounding inflammatory changes. Spleen: Normal in size without focal abnormality. Adrenals/Urinary Tract: Kidneys are unremarkable with no urinary tract calculi or obstructive uropathy. The adrenals are stable. Bladder is unremarkable. Stomach/Bowel: No bowel obstruction or ileus. Scattered colonic diverticulosis without diverticulitis. No bowel wall thickening or inflammatory change. Lymphatic: No pathologic adenopathy. Reproductive: Prostate is unremarkable. Other: No free fluid or free intraperitoneal gas. No abdominal wall hernia. Musculoskeletal: No acute or destructive bony  abnormalities. Reconstructed images demonstrate no additional findings. Review of the MIP images confirms the above findings. IMPRESSION: Vascular: 1. 4 cm ascending thoracic aortic aneurysm. Recommend annual imaging followup by CTA or MRA. This recommendation follows 2010 ACCF/AHA/AATS/ACR/ASA/SCA/SCAI/SIR/STS/SVM Guidelines for the  Diagnosis and Management of Patients with Thoracic Aortic Disease. Circulation. 2010; 121: Z733-z630. Aortic aneurysm NOS (ICD10-I71.9) 2. No evidence of thoracoabdominal aortic dissection. 3. No evidence of pulmonary embolus. 4. Coronary artery atherosclerosis. Nonvascular: 1. Patchy bibasilar ground-glass airspace disease and lower lobe bronchial wall thickening, compatible with inflammation, infection, or aspiration. 2. Hepatic steatosis. 3. Scattered colonic diverticulosis without diverticulitis. Electronically Signed   By: Ozell Daring M.D.   On: 05/04/2024 15:10   DG Chest Portable 1 View Result Date: 05/04/2024 CLINICAL DATA:  Weakness EXAM: PORTABLE CHEST 1 VIEW COMPARISON:  October 07, 2015 FINDINGS: The cardiomediastinal silhouette is unchanged and enlarged in contour.Tortuous thoracic aorta. No pleural effusion. No pneumothorax. No acute pleuroparenchymal abnormality. IMPRESSION: No acute cardiopulmonary abnormality. Electronically Signed   By: Corean Salter M.D.   On: 05/04/2024 13:45    Scheduled Meds:  acetaminophen   1,000 mg Oral TID   azithromycin   500 mg Oral Daily   insulin  aspart  0-5 Units Subcutaneous QHS   insulin  aspart  0-6 Units Subcutaneous Q4H   lidocaine   1 patch Transdermal Q24H   pantoprazole  (PROTONIX ) IV  40 mg Intravenous Q12H   Continuous Infusions:  cefTRIAXone  (ROCEPHIN )  IV 1 g (05/05/24 1615)     LOS: 2 days   Fredia Skeeter, MD [facility]  05/06/2024, 8:16 AM   *Please note that this is a verbal dictation therefore any spelling or grammatical errors are due to the Dragon [employer] One system  interpretation.  Please page via Amion and do not message via secure chat for urgent patient care matters. Secure chat can be used for non urgent patient care matters.  How to contact the [facility] Attending or Consulting provider 7A - 7P or covering provider during after hours 7P -7A, for this patient?  Check the care team in [facility] and look for a) attending/consulting [facility] provider listed and b) the [facility] team listed. Page or secure chat 7A-7P. Log into [email or web address] and use [place]'s universal password to access. If you do not have the password, please contact the hospital operator. Locate the [facility] provider you are looking for under [facility] and page to a number that you can be directly reached. If you still have difficulty reaching the provider, please page the [facility] (Director on Call) for the Hospitalists listed on amion for assistance.

## 2024-05-06 NOTE — H&P (View-Only) (Signed)
 Consultation Note   Referring Provider:   [facility] PCP: Sim Emery CROME, MD Primary Gastroenterologist:   [facility]      Reason for Consultation:  GI Bleed, anemia DOA: 05/04/2024         Hospital Day: 3   ASSESSMENT    63 yo male from [place] admitted with septic shock secondary to CAP Getting IV antibiotics, still with significant leukocytosis but afebrile.  No hypoxia.  Lactic acidosis resolved.   Burns anemia ( new) Reported black stool ( x 2 days) Iron  studies not consistent with iron  deficiency Presenting hgb 12.7 , down from 15.8 in 2021 ( no interval labs). Further decline in hgb since admission to 7.1 however some of the drop may be dilutional after IV fluid bolus.   Chest pain Transient chest pain this am. Normal high-sensitivity troponin. CT angio negative for PE. EKG shows left atrial enlargement, LVH and possible mild ST elevation .  Awaiting echocardiogram  Ascending thoracic aortic aneurysm 4 cm incised on CTA chest this admission  Hypertension  Type 2 Diabetes  Hepatic steatosis Normal liver enzymes  Diffuse idiopathic skeletal hyperostosis (DISH) Chronic back pain  See PMH for any additional medical history  / medical problems  Principal Problem:   Sepsis due to pneumonia (HCC) Active Problems:   OSA (obstructive sleep apnea)   Hypotension due to hypovolemia   Type 2 diabetes mellitus with hyperglycemia, without long-term current use of insulin  (HCC)   Obesity, Class II, BMI 35-39.9   Chronic bilateral low back pain   Pneumonia of both lower lobes due to infectious organism    PLAN:   --For echocardiogram later today --Continue twice daily IV pantoprazole  -- Monitor H&H closely.  He received a unit of RBCs this a.m. for a hemoglobin of 7.1, posttransfusion H&H not yet resulted.  -- Eventual EGD when medically stable.  N.p.o. for now (only 12 hours since last dark BM)   HPI    Brief History:   Patient  admitted 11/22  ago with weakness , diaphoresis, . WBC elevated, lactic acid up, he was hypotensive and briefly required pressors. Chest imaging showed patchy bibasilar ground glass airspace disease . He was admitted with sepsis 2/2 to pneumonia.  Last CBC was back in 2021 at which time his hemoglobin was 15.8.  No interval labs.  Presenting hemoglobin this admission 12.7.  Patient has been improving from a sepsis standpoint   Interval History:  Overnight patient's hgb declined. He has been having very dark stools for 2 days, last one was approximately 12 hours ago.  No nausea nor vomiting. He gives a history of chronic, intermittent epigastric discomfort.  He tells me the discomfort is better when he fasts and drinks only water.  Other times food can relieve the pain .  He definitely had an exacerbation of the pain when he took metformin  for a week several months ago .  He takes Tums as needed which is a significant help. He adamantly denies use of NSAIDs .  He has no prior history of PUD.  He has  never had a colonoscopy.  No known family history of colon cancer.  Earlier this morning while getting up to the restroom he had  some distal lower chest pain, now resolved.   Admission labs /imaging notable for: BUN 27 /normal creatinine, low I.Ca of 0.81, WBC 21K, hemoglobin 10.4, MCV 87, ferritin 115, TIBC 249, iron  saturation 34%, serum iron  85  CTAP with contrast Hepatic steatosis, gallbladder unremarkable, pancreas unremarkable, scattered colonic diverticulosis, no bowel obstruction, no bowel wall thickening or inflammatory changes, 4 cm ascending thoracic aortic aneurysm, patchy bibasilar ground glass densities in lower lobe bronchial wall thickening compatible with inflammation, infection or aspiration   Pertinent GI Studies   none  Labs and Imaging:  Recent Labs    05/04/24 1345  PROT 7.3  ALBUMIN 2.7*  AST 24  ALT 17  ALKPHOS 65  BILITOT 0.9  BILIDIR  0.2  IBILI 0.7   Recent Labs    05/05/24 0111 05/06/24 0207 05/06/24 0437  WBC 21.2* 17.5* 22.0*  HGB 10.4* 6.9* 7.1*  HCT 32.7* 21.3* 22.0*  MCV 87.7 89.5 90.2  PLT 260 203 214   Recent Labs    05/04/24 1345 05/04/24 1358 05/05/24 0111 05/06/24 0207  NA 138 139 142 141  K 4.3 4.2 4.2 4.1  CL 108 113* 111 113*  CO2 25  --  28 21*  GLUCOSE 234* 264* 130* 159*  BUN 27* 31* 29* 30*  CREATININE 0.95 0.80 0.88 0.63  CALCIUM  8.6*  --  8.7* 8.0*     CT Head Wo Contrast CLINICAL DATA:  New onset headache  EXAM: CT HEAD WITHOUT CONTRAST  TECHNIQUE: Contiguous axial images were obtained from the base of the skull through the vertex without intravenous contrast.  RADIATION DOSE REDUCTION: This exam was performed according to the departmental dose-optimization program which includes automated exposure control, adjustment of the mA and/or kV according to patient size and/or use of iterative reconstruction technique.  COMPARISON:  03/02/2018  FINDINGS: Brain: No acute infarct or hemorrhage. Lateral ventricles and midline structures are stable, with a 6 mm colloid cyst again noted. No acute extra-axial fluid collections. No mass effect.  Vascular: No hyperdense vessel or unexpected calcification.  Skull: Normal. Negative for fracture or focal lesion.  Sinuses/Orbits: No acute finding.  Other: None.  IMPRESSION: 1. No acute intracranial process. 2. 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  Electronically Signed   By: Ozell Daring M.D.   On: 05/04/2024 15:12   Past Medical History:  Diagnosis Date   Abscess 10/2004 s/p I and D   peritonsilar   Gout    no aspiration   Hypertension    Obesity    Schistosomiasis 03/03/00    in urine    Past Surgical History:  Procedure Laterality Date   cataract      Family History  Problem Relation Age of Onset   Hypertension Mother     Heart disease Mother 74       died of MI   Diabetes Father    Kidney disease Father    Hypertension Brother    Hypertension Brother     Prior to Admission medications   Medication Sig Start Date End Date Taking? Authorizing Provider  acetaminophen  (TYLENOL ) 500 MG tablet Take 500-1,000 mg by mouth every 6 (six) hours as needed for moderate pain (pain score 4-6).   Yes [provider]  amLODipine  (NORVASC ) 10 MG tablet Take 1 tablet (10 mg total) by mouth daily. 03/19/24  Yes   carvedilol  (COREG ) 6.25 MG tablet Take 1 tablet (6.25 mg total)  by mouth 2 (two) times daily. 03/19/24  Yes   cloNIDine  (CATAPRES ) 0.1 MG tablet Take 1 tablet (0.1 mg total) by mouth 2 (two) times daily. 03/19/24  Yes   lisinopril  (ZESTRIL ) 40 MG tablet Take 1 tablet (40 mg total) by mouth daily. 03/19/24  Yes   metFORMIN  (GLUCOPHAGE ) 500 MG tablet Take 1 tablet (500 mg total) by mouth 2 (two) times daily. Patient not taking: Reported on 05/05/2024 12/12/23   Sim Emery CROME, MD    Current Facility-Administered Medications  Medication Dose Route Frequency Provider Last Rate Last Admin   acetaminophen  (TYLENOL ) tablet 1,000 mg  1,000 mg Oral TID Lou Claretta HERO, MD   1,000 mg at 05/06/24 1015   azithromycin  (ZITHROMAX ) tablet 500 mg  500 mg Oral Daily Amponsah, Prosper M, MD   500 mg at 05/06/24 1016   bisacodyl  (DULCOLAX) EC tablet 5 mg  5 mg Oral Daily PRN Lou Claretta HERO, MD       cefTRIAXone  (ROCEPHIN ) 1 g in sodium chloride  0.9 % 100 mL IVPB  1 g Intravenous Q24H Lou Claretta HERO, MD 200 mL/hr at 05/05/24 1615 1 g at 05/05/24 1615   insulin  aspart (novoLOG ) injection 0-5 Units  0-5 Units Subcutaneous QHS Franky Redia SAILOR, MD       insulin  aspart (novoLOG ) injection 0-6 Units  0-6 Units Subcutaneous Q4H Franky Redia SAILOR, MD   1 Units at 05/06/24 9381   lidocaine  (LIDODERM ) 5 % 1 patch  1 patch Transdermal Q24H Lou Claretta HERO, MD   1 patch at 05/04/24 2141   ondansetron  (ZOFRAN )  tablet 4 mg  4 mg Oral Q6H PRN Lou Claretta HERO, MD       Or   ondansetron  (ZOFRAN ) injection 4 mg  4 mg Intravenous Q6H PRN Lou Claretta HERO, MD       pantoprazole  (PROTONIX ) injection 40 mg  40 mg Intravenous Q12H Franky Redia SAILOR, MD   40 mg at 05/06/24 1016    Allergies as of 05/04/2024 - Review Complete 05/04/2024  Allergen Reaction Noted   Hctz [hydrochlorothiazide] Other (See Comments) 08/30/2010    Social History   Socioeconomic History   Marital status: Married    Spouse name: Not on file   Number of children: Not on file   Years of education: Not on file   Highest education level: Not on file  Occupational History   Not on file  Tobacco Use   Smoking status: Never   Smokeless tobacco: Never  Vaping Use   Vaping status: Never Used  Substance and Sexual Activity   Alcohol use: No   Drug use: No   Sexual activity: Not on file  Other Topics Concern   Not on file  Social History Narrative   Occuaption: accounting, full-time   Married   Most of family has HTN   Social Drivers of Corporate Investment Banker Strain: Not on file  Food Insecurity: Not on file  Transportation Needs: Not on file  Physical Activity: Not on file  Stress: Not on file  Social Connections: Not on file  Intimate Partner Violence: Not on file     Code Status   Code Status: Full Code  Review of Systems: All systems reviewed and negative except where noted in HPI.  Physical Exam: Vital signs in last 24 hours: Temp:  [98.1 F (36.7 C)-98.7 F (37.1 C)] 98.5 F (36.9 C) (11/24 0838) Pulse Rate:  [89-104] 102 (11/24 0838) Resp:  [14-18] 18 (11/24 0838) BP: (  111-164)/(60-93) 140/82 (11/24 0838) SpO2:  [97 %-100 %] 98 % (11/24 0838) Last BM Date : 05/06/24  General:  Pleasant male in NAD Psych:  Cooperative. Normal mood and affect Eyes: Pupils equal Ears:  Normal auditory acuity Nose: No deformity, discharge or lesions Neck:  Supple, no masses felt Lungs:  Clear to  auscultation.  Heart:  Regular rate, regular rhythm.  Abdomen:  Soft, nondistended, nontender, active bowel sounds, no masses felt Rectal :  Deferred Msk: Symmetrical without gross deformities.  Neurologic:  Alert, oriented, grossly normal neurologically Skin:  Intact without significant lesions.    Intake/Output from previous day: 11/23 0701 - 11/24 0700 In: 2921.8 [P.O.:840; I.V.:1881.8; IV Piggyback:200] Out: 600 [Urine:600] Intake/Output this shift:  Total I/O In: 518.1 [Blood:518.1] Out: -    Vina Dasen, NP-C   05/06/2024, 10:21 AM  GI ATTENDING  History, laboratories, x-rays all personally reviewed.  Patient seen and examined as outlined above.  Agree with comprehensive consultation note as outlined above.  Alois presents with community-acquired pneumonia.  Noted to have drifting hemoglobin and a history of dark stools.  Currently stabilizing from his pneumonia.  Agree that an upper endoscopy when clinically appropriate would be in order.  He is higher than baseline risk due to his current acute medical status.  In the interim, continue PPI and follow-up posttransfusion hemoglobin.  Transfuse as needed.  Will follow. A total time of 75 minutes was spent preparing to see the patient, reviewing records, obtaining comprehensive history, performing medically appropriate physical exam, counseling and educating the patient regarding above listed issues, planning for endoscopy, and documenting clinical information in the health record  Solomiya Pascale N. Abran Raddle., M.D. [facility] Division of Gastroenterology

## 2024-05-06 NOTE — Progress Notes (Signed)
  Echocardiogram 2D Echocardiogram has been performed.  Koleen KANDICE Popper, RDCS 05/06/2024, 2:45 PM

## 2024-05-06 NOTE — Significant Event (Addendum)
 Patient's nurse notified me that patient's hemoglobin is 6.9 which is a drop by 3 g from previous.  Repeat hemoglobin stat shows 7.1.  Patient hemodynamically stable patient states that he did not notice some black stools 2 days ago and also had a bloody bowel movement just now.  Will hold patient's pharmacological DVT prophylaxis for now and place patient on SCD.  Will transfuse 1 unit of PRBC.  Patient consented for transfusion.  Check serial CBC.  Consult [facility] keep patient on Protonix  IV.  Redia Cleaver MD.

## 2024-05-06 NOTE — Progress Notes (Signed)
 On call [facility] contacted re: Bloody liquid BM  Per MD - NPO & blood  Blood consent in the pt's chart

## 2024-05-06 NOTE — Plan of Care (Signed)
  Problem: Education: Goal: Knowledge of General Education information will improve Description: Including pain rating scale, medication(s)/side effects and non-pharmacologic comfort measures Outcome: Progressing   Problem: Health Behavior/Discharge Planning: Goal: Ability to manage health-related needs will improve Outcome: Progressing   Problem: Clinical Measurements: Goal: Ability to maintain clinical measurements within normal limits will improve Outcome: Progressing   Problem: Elimination: Goal: Will not experience complications related to bowel motility Outcome: Progressing   Problem: Pain Managment: Goal: General experience of comfort will improve and/or be controlled Outcome: Progressing

## 2024-05-07 ENCOUNTER — Inpatient Hospital Stay (HOSPITAL_COMMUNITY): Admitting: Anesthesiology

## 2024-05-07 ENCOUNTER — Encounter (HOSPITAL_COMMUNITY): Payer: Self-pay | Admitting: Student

## 2024-05-07 ENCOUNTER — Encounter (HOSPITAL_COMMUNITY): Admission: EM | Disposition: A | Payer: Self-pay | Source: Home / Self Care | Attending: Family Medicine

## 2024-05-07 DIAGNOSIS — E119 Type 2 diabetes mellitus without complications: Secondary | ICD-10-CM | POA: Diagnosis not present

## 2024-05-07 DIAGNOSIS — J189 Pneumonia, unspecified organism: Secondary | ICD-10-CM | POA: Diagnosis not present

## 2024-05-07 DIAGNOSIS — I10 Essential (primary) hypertension: Secondary | ICD-10-CM

## 2024-05-07 DIAGNOSIS — K269 Duodenal ulcer, unspecified as acute or chronic, without hemorrhage or perforation: Secondary | ICD-10-CM | POA: Diagnosis not present

## 2024-05-07 DIAGNOSIS — K2950 Unspecified chronic gastritis without bleeding: Secondary | ICD-10-CM | POA: Diagnosis not present

## 2024-05-07 DIAGNOSIS — K921 Melena: Secondary | ICD-10-CM | POA: Diagnosis not present

## 2024-05-07 DIAGNOSIS — G4730 Sleep apnea, unspecified: Secondary | ICD-10-CM | POA: Diagnosis not present

## 2024-05-07 DIAGNOSIS — A419 Sepsis, unspecified organism: Secondary | ICD-10-CM | POA: Diagnosis not present

## 2024-05-07 HISTORY — PX: ESOPHAGOGASTRODUODENOSCOPY: SHX5428

## 2024-05-07 LAB — CBC WITH DIFFERENTIAL/PLATELET
Abs Immature Granulocytes: 0.38 K/uL — ABNORMAL HIGH (ref 0.00–0.07)
Basophils Absolute: 0 K/uL (ref 0.0–0.1)
Basophils Absolute: 0.1 K/uL (ref 0.0–0.1)
Basophils Relative: 0 %
Basophils Relative: 1 %
Eosinophils Absolute: 1.3 K/uL — ABNORMAL HIGH (ref 0.0–0.5)
Eosinophils Absolute: 0.5 K/uL (ref 0.0–0.5)
Eosinophils Relative: 8 %
Eosinophils Relative: 3 %
HCT: 20.1 % — ABNORMAL LOW (ref 39.0–52.0)
HCT: 22 % — ABNORMAL LOW (ref 39.0–52.0)
Hemoglobin: 6.6 g/dL — CL (ref 13.0–17.0)
Hemoglobin: 7.4 g/dL — ABNORMAL LOW (ref 13.0–17.0)
Immature Granulocytes: 2 %
Lymphocytes Relative: 9 %
Lymphocytes Relative: 38 %
Lymphs Abs: 1.5 K/uL (ref 0.7–4.0)
Lymphs Abs: 6.3 K/uL — ABNORMAL HIGH (ref 0.7–4.0)
MCH: 30 pg (ref 26.0–34.0)
MCH: 30.2 pg (ref 26.0–34.0)
MCHC: 32.8 g/dL (ref 30.0–36.0)
MCHC: 33.6 g/dL (ref 30.0–36.0)
MCV: 91.4 fL (ref 80.0–100.0)
MCV: 89.8 fL (ref 80.0–100.0)
Monocytes Absolute: 0 K/uL — ABNORMAL LOW (ref 0.1–1.0)
Monocytes Absolute: 0.8 K/uL (ref 0.1–1.0)
Monocytes Relative: 0 %
Monocytes Relative: 5 %
Neutro Abs: 13.9 K/uL — ABNORMAL HIGH (ref 1.7–7.7)
Neutro Abs: 8.5 K/uL — ABNORMAL HIGH (ref 1.7–7.7)
Neutrophils Relative %: 83 %
Neutrophils Relative %: 51 %
Platelets: 172 K/uL (ref 150–400)
Platelets: 188 K/uL (ref 150–400)
RBC: 2.2 MIL/uL — ABNORMAL LOW (ref 4.22–5.81)
RBC: 2.45 MIL/uL — ABNORMAL LOW (ref 4.22–5.81)
RDW: 14.8 % (ref 11.5–15.5)
RDW: 16.1 % — ABNORMAL HIGH (ref 11.5–15.5)
WBC: 16.7 K/uL — ABNORMAL HIGH (ref 4.0–10.5)
WBC: 16.6 K/uL — ABNORMAL HIGH (ref 4.0–10.5)
nRBC: 0.7 % — ABNORMAL HIGH (ref 0.0–0.2)
nRBC: 1.1 % — ABNORMAL HIGH (ref 0.0–0.2)

## 2024-05-07 LAB — GLUCOSE, CAPILLARY
Glucose-Capillary: 123 mg/dL — ABNORMAL HIGH (ref 70–99)
Glucose-Capillary: 132 mg/dL — ABNORMAL HIGH (ref 70–99)
Glucose-Capillary: 132 mg/dL — ABNORMAL HIGH (ref 70–99)
Glucose-Capillary: 153 mg/dL — ABNORMAL HIGH (ref 70–99)
Glucose-Capillary: 156 mg/dL — ABNORMAL HIGH (ref 70–99)
Glucose-Capillary: 159 mg/dL — ABNORMAL HIGH (ref 70–99)

## 2024-05-07 LAB — BASIC METABOLIC PANEL WITH GFR
Anion gap: 6 (ref 5–15)
BUN: 18 mg/dL (ref 8–23)
CO2: 22 mmol/L (ref 22–32)
Calcium: 7.9 mg/dL — ABNORMAL LOW (ref 8.9–10.3)
Chloride: 114 mmol/L — ABNORMAL HIGH (ref 98–111)
Creatinine, Ser: 0.79 mg/dL (ref 0.61–1.24)
GFR, Estimated: >60 mL/min (ref >60)
Glucose, Bld: 136 mg/dL — ABNORMAL HIGH (ref 70–99)
Potassium: 3.7 mmol/L (ref 3.5–5.1)
Sodium: 142 mmol/L (ref 135–145)

## 2024-05-07 LAB — PREPARE RBC (CROSSMATCH)

## 2024-05-07 SURGERY — EGD (ESOPHAGOGASTRODUODENOSCOPY)
Anesthesia: Monitor Anesthesia Care

## 2024-05-07 MED ORDER — PROPOFOL 500 MG/50ML IV EMUL
INTRAVENOUS | Status: DC | PRN
  Administered 2024-05-07: 60 ug/kg/min via INTRAVENOUS
  Administered 2024-05-07: 60 mg via INTRAVENOUS
  Administered 2024-05-07: 20 mg via INTRAVENOUS

## 2024-05-07 MED ORDER — SODIUM CHLORIDE 0.9 % IV SOLN: INTRAVENOUS | Status: AC

## 2024-05-07 MED ORDER — LIDOCAINE 2% (20 MG/ML) 5 ML SYRINGE
INTRAMUSCULAR | Status: DC | PRN
Start: 2024-05-07 — End: 2024-05-07
  Administered 2024-05-07: 100 mg via INTRAVENOUS

## 2024-05-07 MED ORDER — PHENYLEPHRINE 80 MCG/ML (10ML) SYRINGE FOR IV PUSH (FOR BLOOD PRESSURE SUPPORT)
PREFILLED_SYRINGE | INTRAVENOUS | Status: DC | PRN
  Administered 2024-05-07: 80 ug via INTRAVENOUS
  Administered 2024-05-07: 160 ug via INTRAVENOUS
  Administered 2024-05-07: 80 ug via INTRAVENOUS

## 2024-05-07 MED ORDER — SODIUM CHLORIDE 0.9% IV SOLUTION: Freq: Once | INTRAVENOUS | Status: DC

## 2024-05-07 NOTE — Progress Notes (Signed)
   05/07/24 0639  Vitals  ECG Heart Rate 88  MEWS COLOR  MEWS Score Color Green  MEWS Score  MEWS Temp 0  MEWS Systolic 0  MEWS Pulse 0  MEWS RR 0  MEWS LOC 0  MEWS Score 0  Provider Notification  Provider Name/Title Dr. Franky  Date Provider Notified 05/07/24  Time Provider Notified [phone]  Method of Notification Page  Notification Reason Critical Result (hemoglobin 6.6)  Test performed and critical result hgb 6.6  Date Critical Result Received 05/07/24  Time Critical Result Received 0634  Provider response See new orders  Date of Provider Response 05/07/24  Time of Provider Response [phone]

## 2024-05-07 NOTE — Progress Notes (Signed)
 PT Cancellation Note  Patient Details Name: Shawn Lawrence MRN: [id number] DOB: 02/28/1961   Cancelled Treatment:    Reason Eval/Treat Not Completed: Patient at procedure or test/unavailable (Pt in EGD.  Will reattempt as able.)   [name] 05/07/2024, 10:29 AM [name] M,PT Acute Rehab Services [phone]

## 2024-05-07 NOTE — Progress Notes (Signed)
 Mobility Specialist Progress Note:    05/07/24 0917  Mobility  Activity Ambulated with assistance  Level of Assistance Standby assist, set-up cues, supervision of patient - no hands on  Assistive Device None  Distance Ambulated (ft) 220 ft  Range of Motion/Exercises Active  Activity Response Tolerated well  Mobility Referral Yes  Mobility visit 1 Mobility  Mobility Specialist Start Time (ACUTE ONLY) [id number]  Mobility Specialist Stop Time (ACUTE ONLY) 0926  Mobility Specialist Time Calculation (min) (ACUTE ONLY) 9 min   Received pt EOB agreeable to session before procedure. Currently no c/o any symptoms. Pt able to stand and ambulate w/ no assist. Pt moving well but w/ right foot externally rotated. Returned pt to room w/ all needs met.   Venetia Keel Mobility Specialist Please Neurosurgeon or Rehab Office at [phone]

## 2024-05-07 NOTE — Transfer of Care (Signed)
 Immediate Anesthesia Transfer of Care Note  [name]  Procedure(s) Performed: EGD (ESOPHAGOGASTRODUODENOSCOPY)  Patient Location: PACU and Endoscopy Unit  Anesthesia Type:MAC  Level of Consciousness: awake and alert   Airway & Oxygen Therapy: Patient Spontanous Breathing  Post-op Assessment: Report given to RN and Post -op Vital signs reviewed and stable  Post vital signs: Reviewed and stable  Last Vitals:  Vitals Value Taken Time  BP 120/63   Temp 97.5   Pulse 88 05/07/24 11:24  Resp 19 05/07/24 11:24  SpO2 100 % 05/07/24 11:24  Vitals shown include unfiled device data.  Last Pain:  Vitals:   05/07/24 1040  TempSrc: Temporal  PainSc:          Complications: No notable events documented.

## 2024-05-07 NOTE — Interval H&P Note (Signed)
 History and Physical Interval Note:  05/07/2024 10:33 AM  Shawn Lawrence  has presented today for surgery, with the diagnosis of Gastrointestinal bleeding, melena.  The various methods of treatment have been discussed with the patient and family. After consideration of risks, benefits and other options for treatment, the patient has consented to  Procedure(s): EGD (ESOPHAGOGASTRODUODENOSCOPY) (N/A) as a surgical intervention.  The patient's history has been reviewed, patient examined, no change in status, stable for surgery.  I have reviewed the patient's chart and labs.  Questions were answered to the patient's satisfaction.     [name]

## 2024-05-07 NOTE — Progress Notes (Signed)
 PROGRESS NOTE    Shawn Lawrence  FMW:[id number] DOB: 1960/08/04 DOA: 05/04/2024 PCP: Sim Emery CROME, MD   Brief Narrative:  Shawn Lawrence is a 63 y.o. male with medical history significant for HTN, T2DM, HLD, OSA, obesity and DISH (diffuse idiopathic skeletal hyperostosis) who presented to the ED for evaluation of generalized weakness and diaphoresis. Patient reports he he was in his usual state of health until 2 days ago when he started feeling unwell with some nausea.  While at work 05/04/2024, he felt tired and decided to take a nap. When he woke up, he was significantly weak and diaphoretic so his coworkers called EMS for evaluation.  He reports a chronic low back pain that has been slightly worse for the last few days and poor appetite today but denies any cough, shortness of breath, fever, chills, headache, dizziness, abdominal pain, vomiting, dysuria, constipation, diarrhea or chest pain.   ED Course: Initial vitals show temp 97.4, RR 17-24, HR 80-90s, hypotensive with SBP in the 80s, requiring Levophed  briefly, SpO2 100% on room air. Initial labs significant for WBC 19.4, Hgb 12.7, glucose 234, calcium  8.6, lactic acid 2.8, normal troponin, normal renal function and LFTs, UA with no signs of infection. EKG shows sinus rhythm with LVH and nonspecific ST changes.  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  CTA chest/abdomen/pelvis negative for PE, dissection, aneurysm but shows patchy bibasilar ground glass airspace disease and lower lobe bronchial wall thickening compatible with inflammation, infection or aspiration.  Required Levophed  up to 10 mcg for 3 hours.  He received IV LR 1 L bolus, IV fentanyl , IV calcium  gluconate, IV Zofran , IV azithromycin  and IV Rocephin . [facility]H was consulted for admission.   Assessment & Plan:   Principal Problem:   Sepsis due to pneumonia [facility]) Active Problems:   OSA (obstructive sleep apnea)    Hypotension due to hypovolemia   Type 2 diabetes mellitus with hyperglycemia, without long-term current use of insulin  (HCC)   Obesity, Class II, BMI 35-39.9   Chronic bilateral low back pain   Pneumonia of both lower lobes due to infectious organism   Melena   Acute blood loss anemia  Septic shock secondary to community-acquired pneumonia, POA: Patient met criteria for septic shock based on tachypnea, leukocytosis, lactic acidosis of 2.8, hypotension requiring vasopressors for approximately 3 hours and evidence of pneumonia.  Blood pressure improved but now on the low normal side.  Holding antihypertensives.  Cultures and urine antigen for Legionella and streptococci negative.  On Rocephin  and Zithromax  which we will continue.  Lactic acidosis resolved.  Afebrile, leukocytosis improving.  He is n.p.o. for potential EGD, will resume IV fluids.  Acute blood loss anemia secondary to upper vs lower GI bleed, not POA: Surprisingly, patient's hemoglobin dropped from 12.7 upon arrival to 6.9 overnight on 05/05/2024.  Repeat was 7.1.  He was given 1 unit of PRBC transfusion.  Prior to this, patient did not have any signs of bleeding however he was noted to have 2 episodes of bright red blood per rectum and has not had any bowel movement in last 24 hours.  Initial CT scan did show diverticulosis but no diverticulitis.  Likely diverticular bleed.  GI consulted, they kept him n.p.o. with plans for possible EGD.  Monitoring H&H closely, dropped hemoglobin again down to 6.6 this morning and 1 unit of PRBC transfusion has been ordered again.  Patient remains on Protonix  twice daily.  Appreciate GI help.  Chest pain: While working with PT on 05/05/2024, patient was noted to have 7 out of 10 chest pressure along with diaphoresis which improved with rest.  He did not have any chest pain complaint prior to this.  When he came in, troponin x 2 were negative, CT angio ruled out PE.  EKG shows left atrial enlargement, LVH  and possible mild ST elevation.  No further chest pain complaint.  Repeat EKG shows sinus tachycardia, LVH but no ST-T wave elevation.  Troponins have been negative x 3 this hospitalization.  Chest pain was likely in the setting of occult GI bleeding at that time.  No further chest pain complaints.     Hx of hypertension - Will continue to hold BP meds for today and monitor for now.   T2DM with hyperglycemia blood sugar elevated to the 200s on admission hemoglobin A1c 7.5. Reports he was prescribed metformin  by his PCP but discontinued on due to GI side effects.  Currently blood sugar controlled on SSI.     Hx of DISH ((diffuse idiopathic skeletal hyperostosis)  Chronic low back pain - Multimodal pain control with scheduled Tylenol  and lidocaine  patch   OSA - Not on CPAP   Generalized weakness In the setting of acute illness, PT OT evaluated, recommended cardiac rehab.   Class II obesity Body mass index is 37.97 kg/m.  Ascending thoracic aortic aneurysm: 4 cm ascending thoracic aortic aneurysm. Recommend annual imaging followup by CTA or MRA. This recommendation follows 2010 ACCF/AHA/AATS/ACR/ASA/SCA/SCAI/SIR/STS/SVM Guidelines for the Diagnosis and Management of Patients with Thoracic Aortic Disease. Circulation. 2010; 121: Z733-z630. Aortic aneurysm NOS (ICD10-I71.9)  Morbid obesity class III: Weight loss and diet modification counseled.  DVT prophylaxis: Place and maintain sequential compression device Start: 05/06/24 0346Lovenox   Code Status: Full Code  Family Communication: Multiple family members including wife and friends present at bedside.  Plan of care discussed with patient in length and he/she verbalized understanding and agreed with it.  Status is: Inpatient Remains inpatient appropriate because: Recurrent anemia requiring blood transfusion, EGD today.   Estimated body mass index is 38.75 kg/m as calculated from the following:   Height as of this encounter: 6'  (1.829 m).   Weight as of this encounter: 129.6 kg.    Nutritional Assessment: Body mass index is 38.75 kg/m.Shawn Lawrence Seen by dietician.  I agree with the assessment and plan as outlined below: Nutrition Status:        . Skin Assessment: I have examined the patient's skin and I agree with the wound assessment as performed by the wound care RN as outlined below:    Consultants:  GI  Procedures:  None  Antimicrobials:  Anti-infectives (From admission, onward)    Start     Dose/Rate Route Frequency Ordered Stop   05/05/24 1600  cefTRIAXone  (ROCEPHIN ) 1 g in sodium chloride  0.9 % 100 mL IVPB        1 g 200 mL/hr over 30 Minutes Intravenous Every 24 hours 05/04/24 1947 05/09/24 1559   05/05/24 1000  azithromycin  (ZITHROMAX ) tablet 500 mg        500 mg Oral Daily 05/04/24 1947 05/09/24 0959   05/04/24 1545  cefTRIAXone  (ROCEPHIN ) 1 g in sodium chloride  0.9 % 100 mL IVPB        1 g 200 mL/hr over 30 Minutes Intravenous  Once 05/04/24 1541 05/04/24 1732   05/04/24 1545  azithromycin  (ZITHROMAX ) 500 mg in sodium chloride  0.9 % 250 mL IVPB  500 mg 250 mL/hr over 60 Minutes Intravenous  Once 05/04/24 1541 05/04/24 1908         Subjective: Patient seen and examined this morning, multiple family members at the bedside including his wife.  He was sitting at the edge of the bed.  He denied having any complaints.  He had no questions for me.  Denied having any bowel movement in last 24 hours.  Objective: Vitals:   05/07/24 0006 05/07/24 0408 05/07/24 0725 05/07/24 0745  BP: 129/83 (!) 146/80 (!) 139/98 134/70  Pulse: 92 84 89 90  Resp: 18 18 17 16   Temp: 98.5 F (36.9 C) 97.9 F (36.6 C) 97.6 F (36.4 C) 98.8 F (37.1 C)  TempSrc: Oral Oral Oral Oral  SpO2: 98% 100% 100% 98%  Weight:      Height:        Intake/Output Summary (Last 24 hours) at 05/07/2024 0806 Last data filed at 05/07/2024 0020 Gross per 24 hour  Intake 618.08 ml  Output 900 ml  Net -281.92 ml    Filed Weights   05/04/24 1344 05/04/24 1935  Weight: 127 kg 129.6 kg    Examination:  General exam: Appears calm and comfortable, morbidly obese Respiratory system: Clear to auscultation. Respiratory effort normal. Cardiovascular system: S1 & S2 heard, RRR. No JVD, murmurs, rubs, gallops or clicks. No pedal edema. Gastrointestinal system: Abdomen is nondistended, soft and nontender. No organomegaly or masses felt. Normal bowel sounds heard. Central nervous system: Alert and oriented. No focal neurological deficits. Extremities: Symmetric 5 x 5 power. Skin: No rashes, lesions or ulcers.  Psychiatry: Judgement and insight appear normal. Mood & affect appropriate.    Data Reviewed: I have personally reviewed following labs and imaging studies  CBC: Recent Labs  Lab 05/04/24 1345 05/04/24 1358 05/06/24 0207 05/06/24 0437 05/06/24 1115 05/06/24 1721 05/07/24 0537  WBC 19.4*   < > 17.5* 22.0* 18.2* 20.4* 16.7*  NEUTROABS 7.4  --  9.0* 14.7*  --  15.5* 13.9*  HGB 12.7*   < > 6.9* 7.1* 7.4* 7.4* 6.6*  HCT 38.5*   < > 21.3* 22.0* 22.5* 22.9* 20.1*  MCV 87.1   < > 89.5 90.2 89.6 90.2 91.4  PLT 348   < > 203 214 186 213 172   < > = values in this interval not displayed.   Basic Metabolic Panel: Recent Labs  Lab 05/04/24 1345 05/04/24 1358 05/05/24 0111 05/06/24 0207 05/07/24 0537  NA 138 139 142 141 142  K 4.3 4.2 4.2 4.1 3.7  CL 108 113* 111 113* 114*  CO2 25  --  28 21* 22  GLUCOSE 234* 264* 130* 159* 136*  BUN 27* 31* 29* 30* 18  CREATININE 0.95 0.80 0.88 0.63 0.79  CALCIUM  8.6*  --  8.7* 8.0* 7.9*  MG 1.9  --   --   --   --    GFR: Estimated Creatinine Clearance: 131.5 mL/min (by C-G formula based on SCr of 0.79 mg/dL). Liver Function Tests: Recent Labs  Lab 05/04/24 1345  AST 24  ALT 17  ALKPHOS 65  BILITOT 0.9  PROT 7.3  ALBUMIN 2.7*   Recent Labs  Lab 05/04/24 1345  LIPASE 31   No results for input(s): AMMONIA in the last 168  hours. Coagulation Profile: No results for input(s): INR, PROTIME in the last 168 hours. Cardiac Enzymes: No results for input(s): CKTOTAL, CKMB, CKMBINDEX, TROPONINI in the last 168 hours. BNP (last 3 results) No results for input(s):  PROBNP in the last 8760 hours. HbA1C: Recent Labs    05/04/24 2023  HGBA1C 7.5*   CBG: Recent Labs  Lab 05/06/24 1640 05/06/24 1946 05/07/24 0003 05/07/24 0405 05/07/24 0723  GLUCAP 151* 164* 153* 132* 156*   Lipid Profile: No results for input(s): CHOL, HDL, LDLCALC, TRIG, CHOLHDL, LDLDIRECT in the last 72 hours. Thyroid Function Tests: Recent Labs    05/04/24 2023  TSH 0.339*   Anemia Panel: Recent Labs    05/06/24 0437  VITAMINB12 225  FOLATE 6.8  FERRITIN 115  TIBC 249*  IRON  85  RETICCTPCT 4.4*   Sepsis Labs: Recent Labs  Lab 05/04/24 1653 05/05/24 0111 05/05/24 1040  PROCALCITON  --  0.11  --   LATICACIDVEN 2.8*  --  1.4    Recent Results (from the past 240 hours)  Blood culture (routine x 2)     Status: None (Preliminary result)   Collection Time: 05/04/24  3:41 PM   Specimen: BLOOD  Result Value Ref Range Status   Specimen Description BLOOD LEFT ANTECUBITAL  Final   Special Requests   Final    BOTTLES DRAWN AEROBIC AND ANAEROBIC Blood Culture results may not be optimal due to an inadequate volume of blood received in culture bottles   Culture   Final    NO GROWTH 3 DAYS Performed at [facility], [address], [place], [place] [address]    Report Status PENDING  Incomplete  Blood culture (routine x 2)     Status: None (Preliminary result)   Collection Time: 05/04/24  3:46 PM   Specimen: BLOOD  Result Value Ref Range Status   Specimen Description BLOOD RIGHT ANTECUBITAL  Final   Special Requests   Final    BOTTLES DRAWN AEROBIC AND ANAEROBIC Blood Culture adequate volume   Culture   Final    NO GROWTH 3 DAYS Performed at [facility], [address],  [place], [place] [address]    Report Status PENDING  Incomplete  Respiratory (~20 pathogens) panel by PCR     Status: None   Collection Time: 05/04/24  7:22 PM   Specimen: Anterior Nasal Swab; Respiratory  Result Value Ref Range Status   Adenovirus NOT DETECTED NOT DETECTED Final   Coronavirus 229E NOT DETECTED NOT DETECTED Final    Comment: (NOTE) The Coronavirus on the Respiratory Panel, DOES NOT test for the novel  Coronavirus (2019 nCoV)    Coronavirus HKU1 NOT DETECTED NOT DETECTED Final   Coronavirus NL63 NOT DETECTED NOT DETECTED Final   Coronavirus OC43 NOT DETECTED NOT DETECTED Final   Metapneumovirus NOT DETECTED NOT DETECTED Final   Rhinovirus / Enterovirus NOT DETECTED NOT DETECTED Final   Influenza A NOT DETECTED NOT DETECTED Final   Influenza B NOT DETECTED NOT DETECTED Final   Parainfluenza Virus 1 NOT DETECTED NOT DETECTED Final   Parainfluenza Virus 2 NOT DETECTED NOT DETECTED Final   Parainfluenza Virus 3 NOT DETECTED NOT DETECTED Final   Parainfluenza Virus 4 NOT DETECTED NOT DETECTED Final   Respiratory Syncytial Virus NOT DETECTED NOT DETECTED Final   Bordetella pertussis NOT DETECTED NOT DETECTED Final   Bordetella Parapertussis NOT DETECTED NOT DETECTED Final   Chlamydophila pneumoniae NOT DETECTED NOT DETECTED Final   Mycoplasma pneumoniae NOT DETECTED NOT DETECTED Final    Comment: Performed at [facility], [address], [place], [place] [address]  SARS Coronavirus 2 by RT PCR (hospital order, performed in [facility] hospital lab) *cepheid  single result test*     Status: None   Collection Time: 05/04/24  7:22 PM  Result Value Ref Range Status   SARS Coronavirus 2 by RT PCR NEGATIVE NEGATIVE Final    Comment: Performed at [facility], [address] [place], [place] [address]     Radiology Studies: ECHOCARDIOGRAM COMPLETE Result Date: 05/06/2024    ECHOCARDIOGRAM REPORT   Patient Name:   Shawn Lawrence Date of Exam: 05/06/2024  Medical Rec #:  [id number]               Height:       72.0 in Accession #:    [id number]              Weight:       285.7 lb Date of Birth:  09-15-1960               BSA:          2.479 m Patient Age:    63 years                BP:           140/82 mmHg Patient Gender: M                       HR:           99 bpm. Exam Location:  Inpatient Procedure: 2D Echo, Intracardiac Opacification Agent, Cardiac Doppler and Color            Doppler (Both Spectral and Color Flow Doppler were utilized during            procedure). Indications:    Chest Pain R07.9  History:        Patient has no prior history of Echocardiogram examinations.                 Signs/Symptoms:Chest Pain; Risk Factors:Sleep Apnea,                 Hypertension, Diabetes and Dyslipidemia.  Sonographer:    Koleen Popper RDCS Sonographer#2:  Sherlean Dubin Referring Phys: FREDIA SKEETER  Sonographer Comments: Patient is obese. Image acquisition challenging due to respiratory motion. IMPRESSIONS  1. Left ventricular ejection fraction, by estimation, is 60 to 65%. The left ventricle has normal function. The left ventricle has no regional wall motion abnormalities. Left ventricular diastolic function could not be evaluated due to issues with image  quality  2. Right ventricular systolic function is normal. The right ventricular size is normal.  3. The mitral valve is grossly normal. No evidence of mitral valve regurgitation. No evidence of mitral stenosis.  4. The aortic valve is grossly normal. Aortic valve regurgitation is not visualized. No aortic stenosis is present.  5. The inferior vena cava is normal in size with greater than 50% respiratory variability, suggesting right atrial pressure of 3 mmHg. FINDINGS  Left Ventricle: Left ventricular ejection fraction, by estimation, is 60 to 65%. The left ventricle has normal function. The left ventricle has no regional wall motion abnormalities. Definity  contrast agent was given IV to delineate the left  ventricular  endocardial borders. The left ventricular internal cavity size was normal in size. There is no left ventricular hypertrophy. Left ventricular diastolic function could not be evaluated. Right Ventricle: The right ventricular size is normal. No increase in right ventricular wall thickness. Right ventricular systolic function is normal. Left Atrium: Left atrial size was normal in size. Right Atrium:  Right atrial size was normal in size. Pericardium: There is no evidence of pericardial effusion. Mitral Valve: The mitral valve is grossly normal. No evidence of mitral valve regurgitation. No evidence of mitral valve stenosis. Tricuspid Valve: The tricuspid valve is grossly normal. Tricuspid valve regurgitation is trivial. No evidence of tricuspid stenosis. Aortic Valve: The aortic valve is grossly normal. Aortic valve regurgitation is not visualized. No aortic stenosis is present. Pulmonic Valve: The pulmonic valve was not well visualized. Pulmonic valve regurgitation is not visualized. No evidence of pulmonic stenosis. Aorta: The aortic root and ascending aorta are structurally normal, with no evidence of dilitation. Venous: The inferior vena cava is normal in size with greater than 50% respiratory variability, suggesting right atrial pressure of 3 mmHg. IAS/Shunts: No atrial level shunt detected by color flow Doppler.  LEFT VENTRICLE PLAX 2D LVIDd:         3.80 cm   Diastology LVIDs:         2.80 cm   LV e' medial:    7.29 cm/s LV PW:         1.20 cm   LV E/e' medial:  7.6 LV IVS:        1.60 cm   LV e' lateral:   7.18 cm/s LVOT diam:     2.00 cm   LV E/e' lateral: 7.7 LV SV:         41 LV SV Index:   16 LVOT Area:     3.14 cm  RIGHT VENTRICLE RV S prime:     24.80 cm/s TAPSE (M-mode): 1.8 cm LEFT ATRIUM         Index LA diam:    3.40 cm 1.37 cm/m  AORTIC VALVE LVOT Vmax:   103.00 cm/s LVOT Vmean:  67.500 cm/s LVOT VTI:    0.130 m  AORTA Ao Root diam: 3.20 cm Ao Asc diam:  3.30 cm MITRAL VALVE MV Area  (PHT): 3.68 cm    SHUNTS MV Decel Time: 206 msec    Systemic VTI:  0.13 m MV E velocity: 55.10 cm/s  Systemic Diam: 2.00 cm MV A velocity: 72.90 cm/s MV E/A ratio:  0.76 Morene Brownie Electronically signed by Morene Brownie Signature Date/Time: 05/06/2024/4:52:17 PM    Final     Scheduled Meds:  sodium chloride    Intravenous Once   acetaminophen   1,000 mg Oral TID   azithromycin   500 mg Oral Daily   insulin  aspart  0-6 Units Subcutaneous Q4H   lidocaine   1 patch Transdermal Q24H   pantoprazole  (PROTONIX ) IV  40 mg Intravenous Q12H   Continuous Infusions:  sodium chloride  20 mL (05/06/24 2002)   cefTRIAXone  (ROCEPHIN )  IV 1 g (05/06/24 2006)     LOS: 3 days   Fredia Skeeter, MD [facility]  05/07/2024, 8:06 AM   *Please note that this is a verbal dictation therefore any spelling or grammatical errors are due to the Dragon Medical One system interpretation.  Please page via Amion and do not message via secure chat for urgent patient care matters. Secure chat can be used for non urgent patient care matters.  How to contact the [facility] Attending or Consulting provider 7A - 7P or covering provider during after hours 7P -7A, for this patient?  Check the care team in [facility] and look for a) attending/consulting [facility] provider listed and b) the [facility] team listed. Page or secure chat 7A-7P. Log into [email or web address] and use [facility] universal password to access. If you do not  have the password, please contact the hospital operator. Locate the [facility] provider you are looking for under [facility] and page to a number that you can be directly reached. If you still have difficulty reaching the provider, please page the [facility] (Director on Call) for the Hospitalists listed on amion for assistance.

## 2024-05-07 NOTE — Anesthesia Preprocedure Evaluation (Signed)
 Anesthesia Evaluation  Patient identified by MRN, date of birth, ID band Patient awake    Reviewed: Allergy & Precautions, NPO status , Patient's Chart, lab work & pertinent test results  Airway Mallampati: II  TM Distance: >3 FB Neck ROM: Full    Dental no notable dental hx.    Pulmonary sleep apnea    Pulmonary exam normal        Cardiovascular hypertension, Pt. on medications and Pt. on home beta blockers  Rhythm:Regular Rate:Normal  IMPRESSIONS    1. Left ventricular ejection fraction, by estimation, is 60 to 65%. The left ventricle has normal function. The left ventricle has no regional wall motion abnormalities. Left ventricular diastolic function could not be evaluated due to issues with image  quality  2. Right ventricular systolic function is normal. The right ventricular size is normal.  3. The mitral valve is grossly normal. No evidence of mitral valve regurgitation. No evidence of mitral stenosis.  4. The aortic valve is grossly normal. Aortic valve regurgitation is not visualized. No aortic stenosis is present.  5. The inferior vena cava is normal in size with greater than 50% respiratory variability, suggesting right atrial pressure of 3 mmHg.    Neuro/Psych negative neurological ROS  negative psych ROS   GI/Hepatic Neg liver ROS,,,GIB   Endo/Other  diabetes, Type 2, Oral Hypoglycemic Agents    Renal/GU negative Renal ROS  negative genitourinary   Musculoskeletal negative musculoskeletal ROS (+)    Abdominal Normal abdominal exam  (+)   Peds  Hematology  (+) Blood dyscrasia, anemia   Anesthesia Other Findings   Reproductive/Obstetrics                              Anesthesia Physical Anesthesia Plan  ASA: 3  Anesthesia Plan: MAC   Post-op Pain Management:    Induction: Intravenous  PONV Risk Score and Plan: 1 and Propofol  infusion and Treatment may vary due to age or  medical condition  Airway Management Planned: Simple Face Mask and Nasal Cannula  Additional Equipment: None  Intra-op Plan:   Post-operative Plan:   Informed Consent: I have reviewed the patients History and Physical, chart, labs and discussed the procedure including the risks, benefits and alternatives for the proposed anesthesia with the patient or authorized representative who has indicated his/her understanding and acceptance.     Dental advisory given  Plan Discussed with: CRNA  Anesthesia Plan Comments:         Anesthesia Quick Evaluation

## 2024-05-07 NOTE — TOC CM/SW Note (Addendum)
 Transition of Care [facility]) - Inpatient Brief Assessment   Patient Details  Name: Shawn Lawrence MRN: [id number] Date of Birth: 02-Feb-1961  Transition of [facility]) CM/SW Contact:    Waddell Barnie Rama, RN Phone Number: 05/07/2024, 12:44 PM   Clinical Narrative: From home with spouse, has PCP and insurance on file, states has no HH services in place at this time or DME at home.  States family member will transport them home at [employer] and family is support system, states gets medications from  [facility] and [employer].  Pta self ambulatory.   There are no ICM needs identified  at this time.  Please place consult for ICM needs.  Per PT eval rec cardiac rehab.   Transition of Care Asessment: Insurance and Status: Insurance coverage has been reviewed Patient has primary care physician: Yes Home environment has been reviewed: home with family Prior level of function:: indep Prior/Current Home Services: No current home services Social Drivers of Health Review: SDOH reviewed no interventions necessary Readmission risk has been reviewed: Yes Transition of care needs: no transition of care needs at this time

## 2024-05-07 NOTE — Op Note (Signed)
 [facility] Patient Name: Shawn Lawrence Procedure Date : 05/07/2024 MRN: [id number] Attending MD: Norleen SAILOR. Abran , MD, [id number] Date of Birth: 1961-02-26 CSN: [id number] Age: 63 Admit Type: Inpatient Procedure:                Upper GI endoscopy with biopsies Indications:              Melena Providers:                Norleen SAILOR. Abran, MD, Darleene Bare, RN, Coye Bade, Technician Referring MD:             [facility] hospitalist Medicines:                Monitored Anesthesia Care Complications:            No immediate complications. Estimated Blood Loss:     Estimated blood loss: none. Procedure:                Pre-Anesthesia Assessment:                           - Prior to the procedure, a History and Physical                            was performed, and patient medications and                            allergies were reviewed. The patient's tolerance of                            previous anesthesia was also reviewed. The risks                            and benefits of the procedure and the sedation                            options and risks were discussed with the patient.                            All questions were answered, and informed consent                            was obtained. Prior Anticoagulants: The patient has                            taken no anticoagulant or antiplatelet agents. ASA                            Grade Assessment: III - A patient with severe                            systemic disease. After reviewing the risks and  benefits, the patient was deemed in satisfactory                            condition to undergo the procedure.                           After obtaining informed consent, the endoscope was                            passed under direct vision. Throughout the                            procedure, the patient's blood pressure, pulse, and                             oxygen saturations were monitored continuously. The                            GIF-H190 ([id number]) Olympus endoscope was introduced                            through the mouth, and advanced to the second part                            of duodenum. The upper GI endoscopy was                            accomplished without difficulty. The patient                            tolerated the procedure well. Scope In: Scope Out: Findings:      The esophagus was normal.      The stomach was normal. Biopsies were taken with a cold forceps for       histology to rule out Helicobacter pylori.      A 10 mm non-bleeding cratered duodenal ulcer with no stigmata of       bleeding was found in the duodenal bulb.      The examined duodenum was otherwise normal.      The cardia and gastric fundus were normal on retroflexion. Impression:               1. Clean-based duodenal ulcer. This was the cause                            for GI bleeding                           2. Otherwise normal EGD. Recommendation:           1. Heart healthy diet                           2. Discharge home on pantoprazole  40 mg p.o. twice                            daily x 4 weeks  then 40 mg p.o. daily indefinitely                           3. We will follow-up biopsies to rule out H. pylori                           4. Okay for discharge home later today or in the                            morning.                           5. He should follow-up with his PCP for routine                            checkup. No outpatient GI follow-up required                           Findings discussed with the patient. GI will sign                            off.. Procedure Code(s):        --- Professional ---                           [phone], Esophagogastroduodenoscopy, flexible,                            transoral; with biopsy, single or multiple Diagnosis Code(s):        --- Professional ---                           K26.9, Duodenal  ulcer, unspecified as acute or                            chronic, without hemorrhage or perforation                           K92.1, Melena (includes Hematochezia) CPT copyright 2022 [employer]. All rights reserved. The codes documented in this report are preliminary and upon coder review may  be revised to meet current compliance requirements. Norleen SAILOR. Abran, MD 05/07/2024 11:41:50 AM This report has been signed electronically. Number of Addenda: 0

## 2024-05-07 NOTE — Plan of Care (Signed)
  Problem: Clinical Measurements: Goal: Will remain free from infection Outcome: Progressing Goal: Respiratory complications will improve Outcome: Progressing Goal: Cardiovascular complication will be avoided Outcome: Progressing   Problem: Activity: Goal: Risk for activity intolerance will decrease Outcome: Progressing   Problem: Elimination: Goal: Will not experience complications related to urinary retention Outcome: Progressing   Problem: Safety: Goal: Ability to remain free from injury will improve Outcome: Progressing

## 2024-05-08 LAB — HEMOGLOBIN AND HEMATOCRIT, BLOOD
HCT: 21.3 % — ABNORMAL LOW (ref 39.0–52.0)
Hemoglobin: 7.2 g/dL — ABNORMAL LOW (ref 13.0–17.0)

## 2024-05-08 LAB — GLUCOSE, CAPILLARY
Glucose-Capillary: 128 mg/dL — ABNORMAL HIGH (ref 70–99)
Glucose-Capillary: 136 mg/dL — ABNORMAL HIGH (ref 70–99)
Glucose-Capillary: 151 mg/dL — ABNORMAL HIGH (ref 70–99)
Glucose-Capillary: 164 mg/dL — ABNORMAL HIGH (ref 70–99)
Glucose-Capillary: 168 mg/dL — ABNORMAL HIGH (ref 70–99)
Glucose-Capillary: 190 mg/dL — ABNORMAL HIGH (ref 70–99)
Glucose-Capillary: 208 mg/dL — ABNORMAL HIGH (ref 70–99)

## 2024-05-08 LAB — CBC WITH DIFFERENTIAL/PLATELET
Abs Immature Granulocytes: 0.39 K/uL — ABNORMAL HIGH (ref 0.00–0.07)
Abs Immature Granulocytes: 0.37 K/uL — ABNORMAL HIGH (ref 0.00–0.07)
Basophils Absolute: 0.1 K/uL (ref 0.0–0.1)
Basophils Absolute: 0.1 K/uL (ref 0.0–0.1)
Basophils Relative: 1 %
Basophils Relative: 1 %
Eosinophils Absolute: 0.6 K/uL — ABNORMAL HIGH (ref 0.0–0.5)
Eosinophils Absolute: 0.6 K/uL — ABNORMAL HIGH (ref 0.0–0.5)
Eosinophils Relative: 4 %
Eosinophils Relative: 4 %
HCT: 21.2 % — ABNORMAL LOW (ref 39.0–52.0)
HCT: 22.5 % — ABNORMAL LOW (ref 39.0–52.0)
Hemoglobin: 7.1 g/dL — ABNORMAL LOW (ref 13.0–17.0)
Hemoglobin: 7.4 g/dL — ABNORMAL LOW (ref 13.0–17.0)
Immature Granulocytes: 3 %
Immature Granulocytes: 3 %
Lymphocytes Relative: 37 %
Lymphocytes Relative: 36 %
Lymphs Abs: 5.7 K/uL — ABNORMAL HIGH (ref 0.7–4.0)
Lymphs Abs: 5.3 K/uL — ABNORMAL HIGH (ref 0.7–4.0)
MCH: 30.3 pg (ref 26.0–34.0)
MCH: 29.8 pg (ref 26.0–34.0)
MCHC: 33.5 g/dL (ref 30.0–36.0)
MCHC: 32.9 g/dL (ref 30.0–36.0)
MCV: 90.6 fL (ref 80.0–100.0)
MCV: 90.7 fL (ref 80.0–100.0)
Monocytes Absolute: 0.9 K/uL (ref 0.1–1.0)
Monocytes Absolute: 0.8 K/uL (ref 0.1–1.0)
Monocytes Relative: 6 %
Monocytes Relative: 5 %
Neutro Abs: 7.7 K/uL (ref 1.7–7.7)
Neutro Abs: 7.7 K/uL (ref 1.7–7.7)
Neutrophils Relative %: 49 %
Neutrophils Relative %: 51 %
Platelets: 194 K/uL (ref 150–400)
Platelets: 204 K/uL (ref 150–400)
RBC: 2.34 MIL/uL — ABNORMAL LOW (ref 4.22–5.81)
RBC: 2.48 MIL/uL — ABNORMAL LOW (ref 4.22–5.81)
RDW: 16.5 % — ABNORMAL HIGH (ref 11.5–15.5)
RDW: 16.7 % — ABNORMAL HIGH (ref 11.5–15.5)
Smear Review: NORMAL
WBC: 15.3 K/uL — ABNORMAL HIGH (ref 4.0–10.5)
WBC: 14.7 K/uL — ABNORMAL HIGH (ref 4.0–10.5)
nRBC: 0.9 % — ABNORMAL HIGH (ref 0.0–0.2)
nRBC: 0.7 % — ABNORMAL HIGH (ref 0.0–0.2)

## 2024-05-08 LAB — TYPE AND SCREEN
ABO/RH(D): AB POS
Antibody Screen: NEGATIVE
Unit division: 0
Unit division: 0

## 2024-05-08 LAB — BPAM RBC
Blood Product Expiration Date: 202512032359
Blood Product Expiration Date: 202512182359
ISSUE DATE / TIME: 202511240552
ISSUE DATE / TIME: 202511250753
Unit Type and Rh: 6200
Unit Type and Rh: 8400

## 2024-05-08 LAB — LEGIONELLA PNEUMOPHILA SEROGP 1 UR AG: L. pneumophila Serogp 1 Ur Ag: NEGATIVE

## 2024-05-08 MED ORDER — CARVEDILOL 6.25 MG PO TABS
6.2500 mg | ORAL_TABLET | Freq: Two times a day (BID) | ORAL | Status: DC
  Administered 2024-05-08 – 2024-05-09 (×3): 6.25 mg via ORAL
  Filled 2024-05-08 (×3): qty 1

## 2024-05-08 MED ORDER — SODIUM CHLORIDE 0.9 % IV SOLN
200.0000 mg | Freq: Once | INTRAVENOUS | Status: AC
  Administered 2024-05-08: 200 mg via INTRAVENOUS
  Filled 2024-05-08: qty 10

## 2024-05-08 MED ORDER — PANTOPRAZOLE SODIUM 40 MG PO TBEC
40.0000 mg | DELAYED_RELEASE_TABLET | Freq: Two times a day (BID) | ORAL | Status: DC
  Administered 2024-05-08 – 2024-05-09 (×2): 40 mg via ORAL
  Filled 2024-05-08 (×2): qty 1

## 2024-05-08 NOTE — Progress Notes (Signed)
 Physical Therapy Treatment Patient Details Name: Shawn Lawrence MRN: [id number] DOB: 1961-04-26 Today's Date: 05/08/2024   History of Present Illness The pt is a 63 yo male presenting 11/33 with weakness, nausea and vomiting. Admitted for PNA and sepsis. PMH includes: HTN, DM II, HLD, OSA, obesity (BMI 38), and DISH (diffuse idiopathic skeletal hyperostosis).    PT Comments  The pt was agreeable to session with focus on stairs as pt had onset of 7/10 chest pain when last attempting to complete 3 stairs. He was able to complete 2 sets of 5 step ups followed by a flight of 10 stairs with reports of only 3/10 chest pressure this session. Pt HR did increase from ~110 bpm during gait to 137bpm with flight of stairs. Anticipate that once pt is medically stable, he will be able to return home without continued PT follow up.    If plan is discharge home, recommend the following: A little help with walking and/or transfers;A lot of help with bathing/dressing/bathroom;Help with stairs or ramp for entrance   Can travel by private vehicle        Equipment Recommendations  None recommended by PT    Recommendations for Other Services       Precautions / Restrictions Precautions Precautions: None Recall of Precautions/Restrictions: Intact Restrictions Weight Bearing Restrictions Per Provider Order: No     Mobility  Bed Mobility Overal bed mobility: Independent                  Transfers Overall transfer level: Needs assistance Equipment used: None Transfers: Sit to/from Stand Sit to Stand: Supervision           General transfer comment: no UE support, good stability    Ambulation/Gait Ambulation/Gait assistance: Supervision Gait Distance (Feet): 300 Feet Assistive device: None Gait Pattern/deviations: Step-through pattern, Decreased stride length, Wide base of support Gait velocity: [facility]     General Gait Details: pt with good speed, reports baseline. HR  110   Stairs Stairs: Yes Stairs assistance: Contact guard assist Stair Management: Alternating pattern, Forwards, One rail Left Number of Stairs: 5 (x2 + 10) General stair comments: 3/10 chest pressure after flight of stairs, HR to 137bpm   Wheelchair Mobility     Tilt Bed    Modified Rankin (Stroke Patients Only)       Balance Overall balance assessment: Needs assistance Sitting-balance support: No upper extremity supported, Feet supported Sitting balance-Leahy Scale: Normal     Standing balance support: During functional activity, No upper extremity supported Standing balance-Leahy Scale: Good Standing balance comment: able to ambulate and tolerate mild balance challenge without issue                            Communication Communication Communication: No apparent difficulties  Cognition Arousal: Alert Behavior During Therapy: WFL for tasks assessed/performed   PT - Cognitive impairments: No apparent impairments                       PT - Cognition Comments: pt with flat affect but conversational. not formally assessed Following commands: Intact      Cueing Cueing Techniques: Verbal cues  Exercises      General Comments General comments (skin integrity, edema, etc.): 3/10 chest pressure after flight of stairs, HR 137bpm. otherwise HR 112bpm      Pertinent Vitals/Pain Pain Assessment Pain Assessment: 0-10 Pain Score: 3  Pain Location: chest pressure Pain Descriptors /  Indicators: Pressure Pain Intervention(s): Monitored during session     PT Goals (current goals can now be found in the care plan section) Acute Rehab PT Goals Patient Stated Goal: return home PT Goal Formulation: With patient Time For Goal Achievement: 05/19/24 Potential to Achieve Goals: Good Progress towards PT goals: Progressing toward goals    Frequency    Min 1X/week       AM-PAC PT 6 Clicks Mobility   Outcome Measure  Help needed turning  from your back to your side while in a flat bed without using bedrails?: None Help needed moving from lying on your back to sitting on the side of a flat bed without using bedrails?: None Help needed moving to and from a bed to a chair (including a wheelchair)?: None Help needed standing up from a chair using your arms (e.g., wheelchair or bedside chair)?: None Help needed to walk in hospital room?: None Help needed climbing 3-5 steps with a railing? : A Little 6 Click Score: 23    End of Session Equipment Utilized During Treatment: Gait belt Activity Tolerance: Patient tolerated treatment well Patient left: in bed;with call bell/phone within reach;with family/visitor present Nurse Communication: Mobility status PT Visit Diagnosis: Unsteadiness on feet (R26.81)     Time: 8659-8646 PT Time Calculation (min) (ACUTE ONLY): 13 min  Charges:    $Therapeutic Exercise: 8-22 mins PT General Charges $$ ACUTE PT VISIT: 1 Visit                     Izetta Call, PT, DPT   Acute Rehabilitation Department Office [phone] Secure Chat Communication Preferred   Izetta JULIANNA Call 05/08/2024, 2:40 PM

## 2024-05-08 NOTE — Plan of Care (Signed)
  Problem: Education: Goal: Knowledge of General Education information will improve Description: Including pain rating scale, medication(s)/side effects and non-pharmacologic comfort measures Outcome: Progressing   Problem: Activity: Goal: Risk for activity intolerance will decrease Outcome: Progressing   Problem: Elimination: Goal: Will not experience complications related to bowel motility Outcome: Progressing   Problem: Skin Integrity: Goal: Risk for impaired skin integrity will decrease Outcome: Progressing   Problem: Respiratory: Goal: Ability to maintain adequate ventilation will improve Outcome: Progressing

## 2024-05-08 NOTE — Progress Notes (Signed)
 PROGRESS NOTE    Shawn Lawrence  FMW:[id number] DOB: [birth year] DOA: 05/04/2024 PCP: Sim Emery CROME, MD   Brief Narrative:  Shawn Lawrence is a 63 y.o. male with medical history significant for HTN, T2DM, HLD, OSA, obesity and DISH (diffuse idiopathic skeletal hyperostosis) who presented to the ED for evaluation of generalized weakness and diaphoresis. Patient reports he he was in his usual state of health until 2 days ago when he started feeling unwell with some nausea.  While at work 05/04/2024, he felt tired and decided to take a nap. When he woke up, he was significantly weak and diaphoretic so his coworkers called EMS for evaluation.  He reports a chronic low back pain that has been slightly worse for the last few days and poor appetite today but denies any cough, shortness of breath, fever, chills, headache, dizziness, abdominal pain, vomiting, dysuria, constipation, diarrhea or chest pain.   ED Course: Initial vitals show temp 97.4, RR 17-24, HR 80-90s, hypotensive with SBP in the 80s, requiring Levophed  briefly, SpO2 100% on room air. Initial labs significant for WBC 19.4, Hgb 12.7, glucose 234, calcium  8.6, lactic acid 2.8, normal troponin, normal renal function and LFTs, UA with no signs of infection. EKG shows sinus rhythm with LVH and nonspecific ST changes.  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  CTA chest/abdomen/pelvis negative for PE, dissection, aneurysm but shows patchy bibasilar ground glass airspace disease and lower lobe bronchial wall thickening compatible with inflammation, infection or aspiration.  Required Levophed  up to 10 mcg for 3 hours.  He received IV LR 1 L bolus, IV fentanyl , IV calcium  gluconate, IV Zofran , IV azithromycin  and IV Rocephin . [facility] was consulted for admission.   Assessment & Plan:   Principal Problem:   Sepsis due to pneumonia [facility]) Active Problems:   OSA (obstructive sleep apnea)    Hypotension due to hypovolemia   Type 2 diabetes mellitus with hyperglycemia, without long-term current use of insulin  (HCC)   Obesity, Class II, BMI 35-39.9   Chronic bilateral low back pain   Pneumonia of both lower lobes due to infectious organism   Melena   Acute blood loss anemia   Duodenal ulcer  Septic shock secondary to community-acquired pneumonia, POA: Patient met criteria for septic shock based on tachypnea, leukocytosis, lactic acidosis of 2.8, hypotension requiring vasopressors for approximately 3 hours and evidence of pneumonia.  Blood pressure improved but now on the low normal side.  Holding antihypertensives.  Cultures and urine antigen for Legionella and streptococci negative.  On Rocephin  and Zithromax  which we will continue.  Lactic acidosis resolved.  Afebrile and leukocytosis improving.  Acute blood loss anemia secondary to upper GI bleed/duodenal ulcer: Surprisingly, patient's hemoglobin dropped from 12.7 upon arrival to 6.9 overnight on 05/05/2024.  Repeat was 7.1.  He was given 1 unit of PRBC transfusion.  Prior to this, patient did not have any signs of bleeding however he was noted to have 2 episodes of bright red blood per rectum and has not had any bowel movement in last 24 hours.  Initial CT scan did show diverticulosis but no diverticulitis.  GI consulted, status post EGD 05/07/2024 which showed clean-based duodenal ulcer.  Hemoglobin 7.1 earlier this morning, repeat is 7.2.  Although not quite qualifying blood transfusion range of 7 or below but he is at the borderline.  I will give him IV iron .  To be on the safe side, we would monitor him overnight, repeat  CBC in the morning.  Discussed with the patient, he is in agreement.  Continue Protonix  twice daily for 4 weeks followed by once daily indefinitely per GI recommendations.  Chest pain: While working with PT on 05/05/2024, patient was noted to have 7 out of 10 chest pressure along with diaphoresis which improved with  rest.  He did not have any chest pain complaint prior to this.  When he came in, troponin x 2 were negative, CT angio ruled out PE.  EKG shows left atrial enlargement, LVH and possible mild ST elevation.  No further chest pain complaint.  Repeat EKG shows sinus tachycardia, LVH but no ST-T wave elevation.  Troponins have been negative x 3 this hospitalization.  Chest pain was likely in the setting of occult GI bleeding at that time.  No further chest pain complaints.     Hx of hypertension On multiple antihypertensives PTA.  All of them on hold due to low blood pressure but blood pressure is now rising so I will resume Coreg  but hold rest of the antihypertensives for now.   T2DM with hyperglycemia blood sugar elevated to the 200s on admission hemoglobin A1c 7.5. Reports he was prescribed metformin  by his PCP but discontinued on due to GI side effects.  Currently blood sugar controlled on SSI.     Hx of DISH ((diffuse idiopathic skeletal hyperostosis)  Chronic low back pain - Multimodal pain control with scheduled Tylenol  and lidocaine  patch   OSA - Not on CPAP   Generalized weakness In the setting of acute illness, PT OT evaluated, recommended cardiac rehab.   Class II obesity Body mass index is 37.97 kg/m.  Ascending thoracic aortic aneurysm: 4 cm ascending thoracic aortic aneurysm. Recommend annual imaging followup by CTA or MRA. This recommendation follows 2010 ACCF/AHA/AATS/ACR/ASA/SCA/SCAI/SIR/STS/SVM Guidelines for the Diagnosis and Management of Patients with Thoracic Aortic Disease. Circulation. 2010; 121: Z733-z630. Aortic aneurysm NOS (ICD10-I71.9)  Morbid obesity class III: Weight loss and diet modification counseled.  DVT prophylaxis: Place and maintain sequential compression device Start: 05/06/24 0346Lovenox   Code Status: Full Code  Family Communication: Multiple family members including wife and friends present at bedside.  Plan of care discussed with patient in length  and he/she verbalized understanding and agreed with it.  Status is: Inpatient Remains inpatient appropriate because: Borderline low hemoglobin.  Decided to monitor overnight and repeat CBC in the morning.  Estimated body mass index is 38.75 kg/m as calculated from the following:   Height as of this encounter: 6' (1.829 m).   Weight as of this encounter: 129.6 kg.    Nutritional Assessment: Body mass index is 38.75 kg/m.[name] Seen by dietician.  I agree with the assessment and plan as outlined below: Nutrition Status:        . Skin Assessment: I have examined the patient's skin and I agree with the wound assessment as performed by the wound care RN as outlined below:    Consultants:  GI  Procedures:  None  Antimicrobials:  Anti-infectives (From admission, onward)    Start     Dose/Rate Route Frequency Ordered Stop   05/05/24 1600  cefTRIAXone  (ROCEPHIN ) 1 g in sodium chloride  0.9 % 100 mL IVPB        1 g 200 mL/hr over 30 Minutes Intravenous Every 24 hours 05/04/24 1947 05/09/24 1559   05/05/24 1000  azithromycin  (ZITHROMAX ) tablet 500 mg        500 mg Oral Daily 05/04/24 1947 05/08/24 0852   05/04/24 1545  cefTRIAXone  (ROCEPHIN ) 1 g in sodium chloride  0.9 % 100 mL IVPB        1 g 200 mL/hr over 30 Minutes Intravenous  Once 05/04/24 1541 05/04/24 1732   05/04/24 1545  azithromycin  (ZITHROMAX ) 500 mg in sodium chloride  0.9 % 250 mL IVPB        500 mg 250 mL/hr over 60 Minutes Intravenous  Once 05/04/24 1541 05/04/24 1908         Subjective: Seen and examined.  Wife and friend at the bedside.  Patient has no complaints.  He understands that he needs to stay for close monitoring due to borderline anemia at the verge of requiring possible transfusion.  Objective: Vitals:   05/07/24 1140 05/07/24 2325 05/08/24 0400 05/08/24 1100  BP: 132/66 (!) 151/86 (!) 106/49 (!) 145/78  Pulse: 83 93 81 88  Resp: 18 18 18 20   Temp: 97.7 F (36.5 C) 98.4 F (36.9 C) 98 F (36.7  C) 98.5 F (36.9 C)  TempSrc: Oral Oral Oral Oral  SpO2: 99% 98% 99% 100%  Weight:      Height:        Intake/Output Summary (Last 24 hours) at 05/08/2024 1302 Last data filed at 05/08/2024 0403 Gross per 24 hour  Intake 290 ml  Output 700 ml  Net -410 ml   Filed Weights   05/04/24 1344 05/04/24 1935  Weight: 127 kg 129.6 kg    Examination:  General exam: Appears calm and comfortable, morbidly obese Respiratory system: Clear to auscultation. Respiratory effort normal. Cardiovascular system: S1 & S2 heard, RRR. No JVD, murmurs, rubs, gallops or clicks. No pedal edema. Gastrointestinal system: Abdomen is nondistended, soft and nontender. No organomegaly or masses felt. Normal bowel sounds heard. Central nervous system: Alert and oriented. No focal neurological deficits. Extremities: Symmetric 5 x 5 power. Skin: No rashes, lesions or ulcers.  Psychiatry: Judgement and insight appear normal. Mood & affect appropriate.   Data Reviewed: I have personally reviewed following labs and imaging studies  CBC: Recent Labs  Lab 05/06/24 0437 05/06/24 1115 05/06/24 1721 05/07/24 0537 05/07/24 1822 05/08/24 0518 05/08/24 1133  WBC 22.0* 18.2* 20.4* 16.7* 16.6* 15.3*  --   NEUTROABS 14.7*  --  15.5* 13.9* 8.5* 7.7  --   HGB 7.1* 7.4* 7.4* 6.6* 7.4* 7.1* 7.2*  HCT 22.0* 22.5* 22.9* 20.1* 22.0* 21.2* 21.3*  MCV 90.2 89.6 90.2 91.4 89.8 90.6  --   PLT 214 186 213 172 188 194  --    Basic Metabolic Panel: Recent Labs  Lab 05/04/24 1345 05/04/24 1358 05/05/24 0111 05/06/24 0207 05/07/24 0537  NA 138 139 142 141 142  K 4.3 4.2 4.2 4.1 3.7  CL 108 113* 111 113* 114*  CO2 25  --  28 21* 22  GLUCOSE 234* 264* 130* 159* 136*  BUN 27* 31* 29* 30* 18  CREATININE 0.95 0.80 0.88 0.63 0.79  CALCIUM  8.6*  --  8.7* 8.0* 7.9*  MG 1.9  --   --   --   --    GFR: Estimated Creatinine Clearance: 131.5 mL/min (by C-G formula based on SCr of 0.79 mg/dL). Liver Function Tests: Recent  Labs  Lab 05/04/24 1345  AST 24  ALT 17  ALKPHOS 65  BILITOT 0.9  PROT 7.3  ALBUMIN 2.7*   Recent Labs  Lab 05/04/24 1345  LIPASE 31   No results for input(s): AMMONIA in the last 168 hours. Coagulation Profile: No results for input(s): INR, PROTIME in the  last 168 hours. Cardiac Enzymes: No results for input(s): CKTOTAL, CKMB, CKMBINDEX, TROPONINI in the last 168 hours. BNP (last 3 results) No results for input(s): PROBNP in the last 8760 hours. HbA1C: No results for input(s): HGBA1C in the last 72 hours.  CBG: Recent Labs  Lab 05/07/24 2005 05/08/24 0036 05/08/24 0359 05/08/24 0718 05/08/24 1152  GLUCAP 123* 168* 164* 151* 208*   Lipid Profile: No results for input(s): CHOL, HDL, LDLCALC, TRIG, CHOLHDL, LDLDIRECT in the last 72 hours. Thyroid Function Tests: No results for input(s): TSH, T4TOTAL, FREET4, T3FREE, THYROIDAB in the last 72 hours.  Anemia Panel: Recent Labs    05/06/24 0437  VITAMINB12 225  FOLATE 6.8  FERRITIN 115  TIBC 249*  IRON  85  RETICCTPCT 4.4*   Sepsis Labs: Recent Labs  Lab 05/04/24 1653 05/05/24 0111 05/05/24 1040  PROCALCITON  --  0.11  --   LATICACIDVEN 2.8*  --  1.4    Recent Results (from the past 240 hours)  Blood culture (routine x 2)     Status: None (Preliminary result)   Collection Time: 05/04/24  3:41 PM   Specimen: BLOOD  Result Value Ref Range Status   Specimen Description BLOOD LEFT ANTECUBITAL  Final   Special Requests   Final    BOTTLES DRAWN AEROBIC AND ANAEROBIC Blood Culture results may not be optimal due to an inadequate volume of blood received in culture bottles   Culture   Final    NO GROWTH 4 DAYS Performed at [facility], [address] [place], [place] [address]    Report Status PENDING  Incomplete  Blood culture (routine x 2)     Status: None (Preliminary result)   Collection Time: 05/04/24  3:46 PM   Specimen: BLOOD  Result Value Ref Range  Status   Specimen Description BLOOD RIGHT ANTECUBITAL  Final   Special Requests   Final    BOTTLES DRAWN AEROBIC AND ANAEROBIC Blood Culture adequate volume   Culture   Final    NO GROWTH 4 DAYS Performed at [facility], [address] [place], [place] [address]    Report Status PENDING  Incomplete  Respiratory (~20 pathogens) panel by PCR     Status: None   Collection Time: 05/04/24  7:22 PM   Specimen: Anterior Nasal Swab; Respiratory  Result Value Ref Range Status   Adenovirus NOT DETECTED NOT DETECTED Final   Coronavirus 229E NOT DETECTED NOT DETECTED Final    Comment: (NOTE) The Coronavirus on the Respiratory Panel, DOES NOT test for the novel  Coronavirus (2019 nCoV)    Coronavirus HKU1 NOT DETECTED NOT DETECTED Final   Coronavirus NL63 NOT DETECTED NOT DETECTED Final   Coronavirus OC43 NOT DETECTED NOT DETECTED Final   Metapneumovirus NOT DETECTED NOT DETECTED Final   Rhinovirus / Enterovirus NOT DETECTED NOT DETECTED Final   Influenza A NOT DETECTED NOT DETECTED Final   Influenza B NOT DETECTED NOT DETECTED Final   Parainfluenza Virus 1 NOT DETECTED NOT DETECTED Final   Parainfluenza Virus 2 NOT DETECTED NOT DETECTED Final   Parainfluenza Virus 3 NOT DETECTED NOT DETECTED Final   Parainfluenza Virus 4 NOT DETECTED NOT DETECTED Final   Respiratory Syncytial Virus NOT DETECTED NOT DETECTED Final   Bordetella pertussis NOT DETECTED NOT DETECTED Final   Bordetella Parapertussis NOT DETECTED NOT DETECTED Final   Chlamydophila pneumoniae NOT DETECTED NOT DETECTED Final   Mycoplasma pneumoniae NOT DETECTED NOT DETECTED Final    Comment: Performed  at [facility], [address], [place], [place] [address]  SARS Coronavirus 2 by RT PCR (hospital order, performed in [facility] hospital lab) *cepheid single result test*     Status: None   Collection Time: 05/04/24  7:22 PM  Result Value Ref Range Status   SARS Coronavirus 2 by RT PCR NEGATIVE NEGATIVE Final     Comment: Performed at [facility], [address], [place], [place] [address]     Radiology Studies: ECHOCARDIOGRAM COMPLETE Result Date: 05/06/2024    ECHOCARDIOGRAM REPORT   Patient Name:   Julion G Rowles Date of Exam: 05/06/2024 Medical Rec #:  [id number]               Height:       72.0 in Accession #:    [id number]              Weight:       285.7 lb Date of Birth:  [birth year]               BSA:          2.479 m Patient Age:    63 years                BP:           140/82 mmHg Patient Gender: M                       HR:           99 bpm. Exam Location:  Inpatient Procedure: 2D Echo, Intracardiac Opacification Agent, Cardiac Doppler and Color            Doppler (Both Spectral and Color Flow Doppler were utilized during            procedure). Indications:    Chest Pain R07.9  History:        Patient has no prior history of Echocardiogram examinations.                 Signs/Symptoms:Chest Pain; Risk Factors:Sleep Apnea,                 Hypertension, Diabetes and Dyslipidemia.  Sonographer:    Koleen Popper RDCS Sonographer#2:  Sherlean Dubin Referring Phys: FREDIA SKEETER  Sonographer Comments: Patient is obese. Image acquisition challenging due to respiratory motion. IMPRESSIONS  1. Left ventricular ejection fraction, by estimation, is 60 to 65%. The left ventricle has normal function. The left ventricle has no regional wall motion abnormalities. Left ventricular diastolic function could not be evaluated due to issues with image  quality  2. Right ventricular systolic function is normal. The right ventricular size is normal.  3. The mitral valve is grossly normal. No evidence of mitral valve regurgitation. No evidence of mitral stenosis.  4. The aortic valve is grossly normal. Aortic valve regurgitation is not visualized. No aortic stenosis is present.  5. The inferior vena cava is normal in size with greater than 50% respiratory variability, suggesting right atrial pressure of 3 mmHg.  FINDINGS  Left Ventricle: Left ventricular ejection fraction, by estimation, is 60 to 65%. The left ventricle has normal function. The left ventricle has no regional wall motion abnormalities. Definity  contrast agent was given IV to delineate the left ventricular  endocardial borders. The left ventricular internal cavity size was normal in size. There is no left ventricular hypertrophy. Left ventricular diastolic function could not be evaluated. Right Ventricle: The right  ventricular size is normal. No increase in right ventricular wall thickness. Right ventricular systolic function is normal. Left Atrium: Left atrial size was normal in size. Right Atrium: Right atrial size was normal in size. Pericardium: There is no evidence of pericardial effusion. Mitral Valve: The mitral valve is grossly normal. No evidence of mitral valve regurgitation. No evidence of mitral valve stenosis. Tricuspid Valve: The tricuspid valve is grossly normal. Tricuspid valve regurgitation is trivial. No evidence of tricuspid stenosis. Aortic Valve: The aortic valve is grossly normal. Aortic valve regurgitation is not visualized. No aortic stenosis is present. Pulmonic Valve: The pulmonic valve was not well visualized. Pulmonic valve regurgitation is not visualized. No evidence of pulmonic stenosis. Aorta: The aortic root and ascending aorta are structurally normal, with no evidence of dilitation. Venous: The inferior vena cava is normal in size with greater than 50% respiratory variability, suggesting right atrial pressure of 3 mmHg. IAS/Shunts: No atrial level shunt detected by color flow Doppler.  LEFT VENTRICLE PLAX 2D LVIDd:         3.80 cm   Diastology LVIDs:         2.80 cm   LV e' medial:    7.29 cm/s LV PW:         1.20 cm   LV E/e' medial:  7.6 LV IVS:        1.60 cm   LV e' lateral:   7.18 cm/s LVOT diam:     2.00 cm   LV E/e' lateral: 7.7 LV SV:         41 LV SV Index:   16 LVOT Area:     3.14 cm  RIGHT VENTRICLE RV S prime:      24.80 cm/s TAPSE (M-mode): 1.8 cm LEFT ATRIUM         Index LA diam:    3.40 cm 1.37 cm/m  AORTIC VALVE LVOT Vmax:   103.00 cm/s LVOT Vmean:  67.500 cm/s LVOT VTI:    0.130 m  AORTA Ao Root diam: 3.20 cm Ao Asc diam:  3.30 cm MITRAL VALVE MV Area (PHT): 3.68 cm    SHUNTS MV Decel Time: 206 msec    Systemic VTI:  0.13 m MV E velocity: 55.10 cm/s  Systemic Diam: 2.00 cm MV A velocity: 72.90 cm/s MV E/A ratio:  0.76 Morene Brownie Electronically signed by Morene Brownie Signature Date/Time: 05/06/2024/4:52:17 PM    Final     Scheduled Meds:  sodium chloride    Intravenous Once   acetaminophen   1,000 mg Oral TID   insulin  aspart  0-6 Units Subcutaneous Q4H   lidocaine   1 patch Transdermal Q24H   pantoprazole   40 mg Oral BID   Continuous Infusions:  cefTRIAXone  (ROCEPHIN )  IV 1 g (05/07/24 1653)   iron  sucrose       LOS: 4 days   Fredia Skeeter, MD [facility]  05/08/2024, 1:02 PM   *Please note that this is a verbal dictation therefore any spelling or grammatical errors are due to the Dragon Medical One system interpretation.  Please page via Amion and do not message via secure chat for urgent patient care matters. Secure chat can be used for non urgent patient care matters.  How to contact the [facility] Attending or Consulting provider 7A - 7P or covering provider during after hours 7P -7A, for this patient?  Check the care team in [facility] and look for a) attending/consulting [facility] provider listed and b) the [facility] team listed. Page or secure chat  7A-7P. Log into [email or web address] and use [facility]'s universal password to access. If you do not have the password, please contact the hospital operator. Locate the [facility] provider you are looking for under [facility] and page to a number that you can be directly reached. If you still have difficulty reaching the provider, please page the [facility] (Director on Call) for the Hospitalists listed on amion for assistance.

## 2024-05-08 NOTE — Plan of Care (Signed)
  Problem: Education: Goal: Knowledge of General Education information will improve Description: Including pain rating scale, medication(s)/side effects and non-pharmacologic comfort measures 05/08/2024 0528 by Young Jacobsen, RN Outcome: Progressing 05/08/2024 0528 by Young Jacobsen, RN Outcome: Progressing   Problem: Clinical Measurements: Goal: Diagnostic test results will improve 05/08/2024 0528 by Young Jacobsen, RN Outcome: Progressing 05/08/2024 0528 by Young Jacobsen, RN Outcome: Progressing

## 2024-05-09 ENCOUNTER — Encounter (HOSPITAL_COMMUNITY): Payer: Self-pay | Admitting: Internal Medicine

## 2024-05-09 DIAGNOSIS — J189 Pneumonia, unspecified organism: Secondary | ICD-10-CM | POA: Diagnosis not present

## 2024-05-09 DIAGNOSIS — A419 Sepsis, unspecified organism: Secondary | ICD-10-CM | POA: Diagnosis not present

## 2024-05-09 LAB — CBC WITH DIFFERENTIAL/PLATELET
Abs Immature Granulocytes: 0.54 K/uL — ABNORMAL HIGH (ref 0.00–0.07)
Basophils Absolute: 0.1 K/uL (ref 0.0–0.1)
Basophils Relative: 1 %
Eosinophils Absolute: 0.5 K/uL (ref 0.0–0.5)
Eosinophils Relative: 4 %
HCT: 22.5 % — ABNORMAL LOW (ref 39.0–52.0)
Hemoglobin: 7.5 g/dL — ABNORMAL LOW (ref 13.0–17.0)
Immature Granulocytes: 4 %
Lymphocytes Relative: 35 %
Lymphs Abs: 4.6 K/uL — ABNORMAL HIGH (ref 0.7–4.0)
MCH: 30.1 pg (ref 26.0–34.0)
MCHC: 33.3 g/dL (ref 30.0–36.0)
MCV: 90.4 fL (ref 80.0–100.0)
Monocytes Absolute: 0.7 K/uL (ref 0.1–1.0)
Monocytes Relative: 5 %
Neutro Abs: 6.6 K/uL (ref 1.7–7.7)
Neutrophils Relative %: 51 %
Platelets: 221 K/uL (ref 150–400)
RBC: 2.49 MIL/uL — ABNORMAL LOW (ref 4.22–5.81)
RDW: 17.3 % — ABNORMAL HIGH (ref 11.5–15.5)
WBC: 13 K/uL — ABNORMAL HIGH (ref 4.0–10.5)
nRBC: 0.8 % — ABNORMAL HIGH (ref 0.0–0.2)

## 2024-05-09 LAB — CULTURE, BLOOD (ROUTINE X 2)
Culture: NO GROWTH
Culture: NO GROWTH
Special Requests: ADEQUATE

## 2024-05-09 LAB — GLUCOSE, CAPILLARY
Glucose-Capillary: 149 mg/dL — ABNORMAL HIGH (ref 70–99)
Glucose-Capillary: 158 mg/dL — ABNORMAL HIGH (ref 70–99)
Glucose-Capillary: 122 mg/dL — ABNORMAL HIGH (ref 70–99)

## 2024-05-09 MED ORDER — ASCORBIC ACID 500 MG PO TABS: 500.0000 mg | ORAL_TABLET | Freq: Every day | ORAL | 2 refills | Status: AC

## 2024-05-09 MED ORDER — DICLOFENAC SODIUM 1 % EX GEL
2.0000 g | Freq: Three times a day (TID) | CUTANEOUS | Status: DC | PRN
  Administered 2024-05-09: 2 g via TOPICAL
  Filled 2024-05-09: qty 100

## 2024-05-09 MED ORDER — VITAMIN B-12 1000 MCG PO TABS: 1000.0000 ug | ORAL_TABLET | Freq: Every day | ORAL | Status: DC

## 2024-05-09 MED ORDER — POLYSACCHARIDE IRON COMPLEX 150 MG PO CAPS: 150.0000 mg | ORAL_CAPSULE | Freq: Every day | ORAL | 2 refills | Status: AC

## 2024-05-09 MED ORDER — CYANOCOBALAMIN 1000 MCG/ML IJ SOLN
1000.0000 ug | Freq: Once | INTRAMUSCULAR | Status: AC
  Administered 2024-05-09: 1000 ug via INTRAMUSCULAR
  Filled 2024-05-09: qty 1

## 2024-05-09 MED ORDER — FOLIC ACID 1 MG PO TABS
1.0000 mg | ORAL_TABLET | Freq: Every day | ORAL | Status: DC
  Administered 2024-05-09: 1 mg via ORAL
  Filled 2024-05-09: qty 1

## 2024-05-09 MED ORDER — PANTOPRAZOLE SODIUM 40 MG PO TBEC: DELAYED_RELEASE_TABLET | ORAL | 0 refills | Status: AC

## 2024-05-09 MED ORDER — FOLIC ACID 1 MG PO TABS: 1.0000 mg | ORAL_TABLET | Freq: Every day | ORAL | 0 refills | Status: AC

## 2024-05-09 MED ORDER — CYANOCOBALAMIN 1000 MCG PO TABS: 1000.0000 ug | ORAL_TABLET | Freq: Every day | ORAL | 2 refills | Status: AC

## 2024-05-09 NOTE — TOC Transition Note (Signed)
 Transition of Care [facility]) - Discharge Note   Patient Details  Name: Shawn Lawrence MRN: [id number] Date of Birth: 12/26/1960  [facility] of [facility]) CM/SW Contact:  Landry DELENA Senters, RN Phone Number: 05/09/2024, 12:57 PM   Clinical Narrative:     Patient to discharge home today with family providing transportation.   No needs identified by CM.  Final next level of care: Home/Self Care Barriers to Discharge: No Barriers Identified   Patient Goals and CMS Choice            Discharge Placement                       Discharge Plan and Services Additional resources added to the After Visit Summary for                                       Social Drivers of Health (SDOH) Interventions SDOH Screenings   Food Insecurity: No Food Insecurity (05/08/2024)  Housing: Unknown (05/08/2024)  Transportation Needs: No Transportation Needs (05/08/2024)  Utilities: Not At Risk (05/08/2024)  Tobacco Use: Low Risk  (05/07/2024)     Readmission Risk Interventions    05/07/2024   12:42 PM  Readmission Risk Prevention Plan  Post Dischage Appt Complete  Medication Screening Complete  Transportation Screening Complete

## 2024-05-09 NOTE — Plan of Care (Signed)
   Problem: Clinical Measurements: Goal: Diagnostic test results will improve Outcome: Progressing

## 2024-05-09 NOTE — Anesthesia Postprocedure Evaluation (Signed)
 Anesthesia Post Note  Patient: Askari G Hejl  Procedure(s) Performed: EGD (ESOPHAGOGASTRODUODENOSCOPY)     Patient location during evaluation: PACU Anesthesia Type: MAC Level of consciousness: awake and alert Pain management: pain level controlled Vital Signs Assessment: post-procedure vital signs reviewed and stable Respiratory status: spontaneous breathing, nonlabored ventilation, respiratory function stable and patient connected to nasal cannula oxygen Cardiovascular status: stable and blood pressure returned to baseline Postop Assessment: no apparent nausea or vomiting Anesthetic complications: no   No notable events documented.  Last Vitals:  Vitals:   05/09/24 0409 05/09/24 0747  BP: (!) 155/86 (!) 150/82  Pulse: 74 75  Resp: 18 20  Temp: 36.9 C 36.7 C  SpO2: 97% 99%    Last Pain:  Vitals:   05/09/24 0747  TempSrc: Oral  PainSc:                  Cordella SQUIBB Razi Hickle

## 2024-05-09 NOTE — Plan of Care (Signed)
  Problem: Education: Goal: Knowledge of General Education information will improve Description: Including pain rating scale, medication(s)/side effects and non-pharmacologic comfort measures Outcome: Progressing   Problem: Clinical Measurements: Goal: Ability to maintain clinical measurements within normal limits will improve Outcome: Progressing   Problem: Clinical Measurements: Goal: Will remain free from infection Outcome: Progressing   Problem: Clinical Measurements: Goal: Diagnostic test results will improve Outcome: Progressing   Problem: Clinical Measurements: Goal: Respiratory complications will improve Outcome: Progressing   Problem: Activity: Goal: Risk for activity intolerance will decrease Outcome: Progressing   Problem: Pain Managment: Goal: General experience of comfort will improve and/or be controlled Outcome: Progressing   Problem: Safety: Goal: Ability to remain free from injury will improve Outcome: Progressing   Problem: Activity: Goal: Ability to tolerate increased activity will improve Outcome: Progressing

## 2024-05-09 NOTE — Discharge Summary (Signed)
 [facility] Discharge Summary   [name] FMW:[id number]  PCP: Shawn Emery CROME, MD  Date of admission: 05/04/2024   Date of discharge:  05/09/2024     Discharge Diagnoses:  Principal Problem:   Sepsis due to pneumonia [facility]) Active Problems:   OSA (obstructive sleep apnea)   Hypotension due to hypovolemia   Type 2 diabetes mellitus with hyperglycemia, without long-term current use of insulin  (HCC)   Obesity, Class II, BMI 35-39.9   Chronic bilateral low back pain   Pneumonia of both lower lobes due to infectious organism   Melena   Acute blood loss anemia   Duodenal ulcer   Admitted From: Home Disposition:  Home   Recommendations for Outpatient Follow-up:  F/u with PCP in w k Repeat CBC in 1 wk CXR in 4 weeks Repeat Iron  profile, folic acid  and B-12 level in btw 3-6 months F/u on pending H.pylori biopsy report Follow up LABS/TEST:  As above   Follow-up Information     Shawn Emery CROME, MD Follow up in 1 week(s).   Specialty: Internal Medicine Why: CBC in 1 wk and CXR in 4 weeks Contact information: [address] [place] [address] [phone]                Diet recommendation: Cardiac and Carb modified diet  Activity: The patient is advised to gradually reintroduce usual activities, as tolerated  Discharge Condition: stable  Code Status: Full code   History of present illness: As per the H and P dictated on admission.  Hospital Course:  Shawn Lawrence is a 63 y.o. male with medical history significant for HTN, T2DM, HLD, OSA, obesity and DISH (diffuse idiopathic skeletal hyperostosis) who presented to the ED for evaluation of generalized weakness and diaphoresis. Patient reports he he was in his usual state of health until 2 days ago when he started feeling unwell with some nausea.  While at work 05/04/2024, he felt tired and decided to take a nap. When he woke up, he was significantly weak and diaphoretic so  his coworkers called EMS for evaluation.  He reports a chronic low back pain that has been slightly worse for the last few days and poor appetite today but denies any cough, shortness of breath, fever, chills, headache, dizziness, abdominal pain, vomiting, dysuria, constipation, diarrhea or chest pain.   ED Course: Initial vitals show temp 97.4, RR 17-24, HR 80-90s, hypotensive with SBP in the 80s, requiring Levophed  briefly, SpO2 100% on room air. Initial labs significant for WBC 19.4, Hgb 12.7, glucose 234, calcium  8.6, lactic acid 2.8, normal troponin, normal renal function and LFTs, UA with no signs of infection. EKG shows sinus rhythm with LVH and nonspecific ST changes.  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  CTA chest/abdomen/pelvis negative for PE, dissection, aneurysm but shows patchy bibasilar ground glass airspace disease and lower lobe bronchial wall thickening compatible with inflammation, infection or aspiration.  Required Levophed  up to 10 mcg for 3 hours.  He received IV LR 1 L bolus, IV fentanyl , IV calcium  gluconate, IV Zofran , IV azithromycin  and IV Rocephin . TRH was consulted for admission.    Assessment & Plan:    # Septic shock secondary to community-acquired pneumonia, POA:  Patient met criteria for septic shock based on tachypnea, leukocytosis, lactic acidosis of 2.8, hypotension requiring vasopressors for approximately 3 hours and evidence of pneumonia.  Blood pressure improved on discharge. Cultures and urine antigen for Legionella  and streptococci negative.  Lactic acidosis resolved.  Afebrile and leukocytosis improving. S/p Rocephin  and Zithromax .  Completed course, patient does not need more antibiotics.  Recommended to follow with PCP and repeat chest x-ray after 4 weeks for resolution of pneumonia.  # Acute blood loss anemia secondary to upper GI bleed/duodenal ulcer: Surprisingly, patient's hemoglobin dropped from 12.7 upon arrival  to 6.9 overnight on 05/05/2024.  Repeat was 7.1. s/p 1 unit of PRBC transfusion.  Prior to this, patient did not have any signs of bleeding however he was noted to have 2 episodes of bright red blood per rectum and has not had any bowel movement in last 24 hours.  Initial CT scan did show diverticulosis but no diverticulitis.  GI consulted, s/p EGD 05/07/2024 which showed clean-based duodenal ulcer.  Hemoglobin 7.1 earlier this morning, repeat is 7.2.  Although not quite qualifying blood transfusion range of 7 or below but he is at the borderline.  S/p IV iron  x 1 dose given. Continue pantoprazole  11/27 Hb 7.5, stable, patient denies any GI bleeding.  Seems stable to discharge home. Patient was discharged on pantoprazole  40 mg p.o. twice daily for 30 days followed by daily.  Recommended to follow with GI as an outpatient.  Follow-up pending H. pylori biopsy report.  # Anemia workup, iron  level within normal range. # Folic acid  6.8, at lower end.  Started folic acid  1 mg p.o. daily to prevent deficiency.  Follow-up with PCP to repeat folic acid  level in between 3 to 6 months. # Vitamin B12 level 225, goal 400: s/p vitamin B12 1000 mcg IM injection x 1 dose, followed by oral supplement.  Follow-up PCP to repeat vitamin B12 level in between 3 to 6 months  Continue oral iron  supplement Follow-up with PCP in 1 week and repeat CBC in 1 week     # Chest pain: While working with PT on 05/05/2024, patient was noted to have 7 out of 10 chest pressure along with diaphoresis which improved with rest.  He did not have any chest pain complaint prior to this.  When he came in, troponin x 2 were negative, CT angio ruled out PE.  EKG shows left atrial enlargement, LVH and possible mild ST elevation.  No further chest pain complaint.  Repeat EKG shows sinus tachycardia, LVH but no ST-T wave elevation.  Troponins have been negative x 3 this hospitalization.  Chest pain was likely in the setting of occult GI bleeding at  that time.  No further chest pain complaints.     # Hx of hypertension On multiple antihypertensives PTA.  All of them on hold due to low blood pressure but blood pressure is now rising so resumed Coreg .  Blood pressure was increasing, resumed Coreg  and clonidine  on discharge.  Patient was advised to gradually resume lisinopril  and amlodipine , follow BP parameters.  Monitor BP at home and follow-up with PCP to titrate medications accordingly.    # T2DM with hyperglycemia blood sugar elevated to the 200s on admission hemoglobin A1c 7.5. Reports he was prescribed metformin  by his PCP but discontinued on due to GI side effects.  S/p NovoLog  sliding scale.  Patient was advised to continue diabetic diet and follow with PCP for further management as an outpatient.   Hx of DISH ((diffuse idiopathic skeletal hyperostosis)  Chronic low back pain - Multimodal pain control with scheduled Tylenol  and lidocaine  patch   OSA: Not on CPAP   Generalized weakness In the setting of acute illness, PT  OT evaluated, recommended cardiac rehab.   # Ascending thoracic aortic aneurysm: 4 cm ascending thoracic aortic aneurysm. Recommend annual imaging followup by CTA or MRA.  Recommended to follow with PCP.   # Class II obesity Body mass index is 38.75 kg/m.  Nutrition Interventions:Calorie restricted diet and daily exercise advised to lose body weight.  Lifestyle modification discussed.   Patient was ambulatory without any assistance.  On the day of the discharge the patient's vitals were stable, and no other acute medical condition were reported by patient. the patient was felt safe to be discharge at Home.  Consultants: GI Procedures: s/p EGD  Discharge Exam: General: Appear in no distress, Oral Mucosa Clear, moist. Cardiovascular: S1 and S2 Present, no Murmur, Respiratory: normal respiratory effort, Bilateral Air entry present and no Crackles, no wheezes Abdomen: Bowel Sound present, Soft and no  tenderness. Extremities: no Pedal edema, no calf tenderness Neurology: alert and oriented to time, place, and person affect appropriate.  Filed Weights   05/04/24 1344 05/04/24 1935  Weight: 127 kg 129.6 kg   Vitals:   05/09/24 0747 05/09/24 1133  BP: (!) 150/82 133/67  Pulse: 75 74  Resp: 20 20  Temp: 98.1 F (36.7 C) 98 F (36.7 C)  SpO2: 99% 98%    DISCHARGE MEDICATION: Allergies as of 05/09/2024       Reactions   Hctz [hydrochlorothiazide] Other (See Comments)   Exacerbation of gout   Porcine (pork) Protein-containing Drug Products         Medication List     PAUSE taking these medications    amLODipine  10 MG tablet Wait to take this until your doctor or other care provider tells you to start again. Commonly known as: NORVASC  Take 1 tablet (10 mg total) by mouth daily.   lisinopril  40 MG tablet Wait to take this until your doctor or other care provider tells you to start again. Commonly known as: ZESTRIL  Take 1 tablet (40 mg total) by mouth daily.       STOP taking these medications    metFORMIN  500 MG tablet Commonly known as: GLUCOPHAGE        TAKE these medications    acetaminophen  500 MG tablet Commonly known as: TYLENOL  Take 500-1,000 mg by mouth every 6 (six) hours as needed for moderate pain (pain score 4-6).   ascorbic acid  500 MG tablet Commonly known as: VITAMIN C Take 1 tablet (500 mg total) by mouth daily.   carvedilol  6.25 MG tablet Commonly known as: COREG  Take 1 tablet (6.25 mg total) by mouth 2 (two) times daily.   cloNIDine  0.1 MG tablet Commonly known as: CATAPRES  Take 1 tablet (0.1 mg total) by mouth 2 (two) times daily.   cyanocobalamin  1000 MCG tablet Take 1 tablet (1,000 mcg total) by mouth daily. Start taking on: May 10, 2024   folic acid  1 MG tablet Commonly known as: FOLVITE  Take 1 tablet (1 mg total) by mouth daily. Start taking on: May 10, 2024   iron  polysaccharides 150 MG capsule Commonly  known as: NIFEREX Take 1 capsule (150 mg total) by mouth daily.   pantoprazole  40 MG tablet Commonly known as: PROTONIX  Take 1 tablet (40 mg total) by mouth 2 (two) times daily for 30 days, THEN 1 tablet (40 mg total) daily. Start taking on: May 09, 2024       Allergies  Allergen Reactions   Hctz [Hydrochlorothiazide] Other (See Comments)    Exacerbation of gout   Porcine (Pork) Protein-Containing Drug Products  Discharge Instructions     Call MD for:   Complete by: As directed    GI bleeding   Call MD for:  difficulty breathing, headache or visual disturbances   Complete by: As directed    Call MD for:  extreme fatigue   Complete by: As directed    Call MD for:  persistant dizziness or light-headedness   Complete by: As directed    Call MD for:  persistant nausea and vomiting   Complete by: As directed    Call MD for:  severe uncontrolled pain   Complete by: As directed    Call MD for:  temperature >100.4   Complete by: As directed    Diet - low sodium heart healthy   Complete by: As directed    Discharge instructions   Complete by: As directed    F/u with PCP in w k Repeat CBC in 1 wk CXR in 4 weeks Repeat Iron  profile, folic acid  and B-12 level in btw 3-6 months F/u on pending H.pylori biopsy report   Increase activity slowly   Complete by: As directed        The results of significant diagnostics from this hospitalization (including imaging, microbiology, ancillary and laboratory) are listed below for reference.    Significant Diagnostic Studies: ECHOCARDIOGRAM COMPLETE Result Date: 05/06/2024    ECHOCARDIOGRAM REPORT   Patient Name:   Shawn Lawrence Date of Exam: 05/06/2024 Medical Rec #:  [id number]               Height:       72.0 in Accession #:    [id number]              Weight:       285.7 lb Date of Birth:  1960-12-11               BSA:          2.479 m Patient Age:    63 years                BP:           140/82 mmHg Patient Gender: M                        HR:           99 bpm. Exam Location:  Inpatient Procedure: 2D Echo, Intracardiac Opacification Agent, Cardiac Doppler and Color            Doppler (Both Spectral and Color Flow Doppler were utilized during            procedure). Indications:    Chest Pain R07.9  History:        Patient has no prior history of Echocardiogram examinations.                 Signs/Symptoms:Chest Pain; Risk Factors:Sleep Apnea,                 Hypertension, Diabetes and Dyslipidemia.  Sonographer:    Koleen Popper RDCS Sonographer#2:  Sherlean Dubin Referring Phys: FREDIA SKEETER  Sonographer Comments: Patient is obese. Image acquisition challenging due to respiratory motion. IMPRESSIONS  1. Left ventricular ejection fraction, by estimation, is 60 to 65%. The left ventricle has normal function. The left ventricle has no regional wall motion abnormalities. Left ventricular diastolic function could not be evaluated due to issues with image  quality  2. Right ventricular systolic function is normal. The  right ventricular size is normal.  3. The mitral valve is grossly normal. No evidence of mitral valve regurgitation. No evidence of mitral stenosis.  4. The aortic valve is grossly normal. Aortic valve regurgitation is not visualized. No aortic stenosis is present.  5. The inferior vena cava is normal in size with greater than 50% respiratory variability, suggesting right atrial pressure of 3 mmHg. FINDINGS  Left Ventricle: Left ventricular ejection fraction, by estimation, is 60 to 65%. The left ventricle has normal function. The left ventricle has no regional wall motion abnormalities. Definity  contrast agent was given IV to delineate the left ventricular  endocardial borders. The left ventricular internal cavity size was normal in size. There is no left ventricular hypertrophy. Left ventricular diastolic function could not be evaluated. Right Ventricle: The right ventricular size is normal. No increase in right  ventricular wall thickness. Right ventricular systolic function is normal. Left Atrium: Left atrial size was normal in size. Right Atrium: Right atrial size was normal in size. Pericardium: There is no evidence of pericardial effusion. Mitral Valve: The mitral valve is grossly normal. No evidence of mitral valve regurgitation. No evidence of mitral valve stenosis. Tricuspid Valve: The tricuspid valve is grossly normal. Tricuspid valve regurgitation is trivial. No evidence of tricuspid stenosis. Aortic Valve: The aortic valve is grossly normal. Aortic valve regurgitation is not visualized. No aortic stenosis is present. Pulmonic Valve: The pulmonic valve was not well visualized. Pulmonic valve regurgitation is not visualized. No evidence of pulmonic stenosis. Aorta: The aortic root and ascending aorta are structurally normal, with no evidence of dilitation. Venous: The inferior vena cava is normal in size with greater than 50% respiratory variability, suggesting right atrial pressure of 3 mmHg. IAS/Shunts: No atrial level shunt detected by color flow Doppler.  LEFT VENTRICLE PLAX 2D LVIDd:         3.80 cm   Diastology LVIDs:         2.80 cm   LV e' medial:    7.29 cm/s LV PW:         1.20 cm   LV E/e' medial:  7.6 LV IVS:        1.60 cm   LV e' lateral:   7.18 cm/s LVOT diam:     2.00 cm   LV E/e' lateral: 7.7 LV SV:         41 LV SV Index:   16 LVOT Area:     3.14 cm  RIGHT VENTRICLE RV S prime:     24.80 cm/s TAPSE (M-mode): 1.8 cm LEFT ATRIUM         Index LA diam:    3.40 cm 1.37 cm/m  AORTIC VALVE LVOT Vmax:   103.00 cm/s LVOT Vmean:  67.500 cm/s LVOT VTI:    0.130 m  AORTA Ao Root diam: 3.20 cm Ao Asc diam:  3.30 cm MITRAL VALVE MV Area (PHT): 3.68 cm    SHUNTS MV Decel Time: 206 msec    Systemic VTI:  0.13 m MV E velocity: 55.10 cm/s  Systemic Diam: 2.00 cm MV A velocity: 72.90 cm/s MV E/A ratio:  0.76 Morene Brownie Electronically signed by Morene Brownie Signature Date/Time: 05/06/2024/4:52:17 PM     Final    CT Head Wo Contrast Result Date: 05/04/2024 CLINICAL DATA:  New onset headache EXAM: CT HEAD WITHOUT CONTRAST TECHNIQUE: Contiguous axial images were obtained from the base of the skull through the vertex without intravenous contrast. RADIATION DOSE REDUCTION: This exam was performed according  to the departmental dose-optimization program which includes automated exposure control, adjustment of the mA and/or kV according to patient size and/or use of iterative reconstruction technique. COMPARISON:  03/02/2018 FINDINGS: Brain: No acute infarct or hemorrhage. Lateral ventricles and midline structures are stable, with a 6 mm colloid cyst again noted. No acute extra-axial fluid collections. No mass effect. Vascular: No hyperdense vessel or unexpected calcification. Skull: Normal. Negative for fracture or focal lesion. Sinuses/Orbits: No acute finding. Other: None. IMPRESSION: 1. No acute intracranial process. 2. 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 Electronically Signed   By: Ozell Daring M.D.   On: 05/04/2024 15:12   CT Angio Chest/Abd/Pel for Dissection W and/or Wo Contrast Result Date: 05/04/2024 CLINICAL DATA:  Acute aortic syndrome, weakness, headache EXAM: CT ANGIOGRAPHY CHEST, ABDOMEN AND PELVIS TECHNIQUE: Non-contrast CT of the chest was initially obtained. Multidetector CT imaging through the chest, abdomen and pelvis was performed using the standard protocol during bolus administration of intravenous contrast. Multiplanar reconstructed images and MIPs were obtained and reviewed to evaluate the vascular anatomy. RADIATION DOSE REDUCTION: This exam was performed according to the departmental dose-optimization program which includes automated exposure control, adjustment of the mA and/or kV according to patient size and/or use of iterative reconstruction technique. CONTRAST:  OMNIPAQUE  IOHEXOL  350 MG/ML SOLN  COMPARISON:  05/10/2020, 05/04/2024 FINDINGS: CTA CHEST FINDINGS Cardiovascular: There is a 4 cm ascending thoracic aortic aneurysm. No evidence of thoracic aortic dissection. The heart is unremarkable without pericardial effusion. Prominent atherosclerosis within the LAD distribution of the coronary vasculature. There is technically adequate opacification of the pulmonary vasculature. No filling defects or pulmonary emboli. Mediastinum/Nodes: No enlarged mediastinal, hilar, or axillary lymph nodes. Thyroid gland, trachea, and esophagus demonstrate no significant findings. Lungs/Pleura: There is patchy ground-glass airspace disease within the dependent lower lobes, left greater than right, with mild lower lobe bronchial wall thickening. This may be related to infection, inflammation, or aspiration. No effusion or pneumothorax. Musculoskeletal: No acute or destructive bony abnormalities. Reconstructed images demonstrate no additional findings. Review of the MIP images confirms the above findings. CTA ABDOMEN AND PELVIS FINDINGS VASCULAR Aorta: Normal caliber aorta without aneurysm, dissection, vasculitis or significant stenosis. Celiac: Patent without evidence of aneurysm, dissection, vasculitis or significant stenosis. SMA: Patent without evidence of aneurysm, dissection, vasculitis or significant stenosis. Renals: Both renal arteries are patent without evidence of aneurysm, dissection, vasculitis, fibromuscular dysplasia or significant stenosis. IMA: Patent without evidence of aneurysm, dissection, vasculitis or significant stenosis. Inflow: Patent without evidence of aneurysm, dissection, vasculitis or significant stenosis. Minimal atherosclerosis. Veins: No obvious venous abnormality within the limitations of this arterial phase study. Review of the MIP images confirms the above findings. NON-VASCULAR Hepatobiliary: Hepatic steatosis. No focal liver abnormality. The gallbladder is unremarkable. Pancreas:  Unremarkable. No pancreatic ductal dilatation or surrounding inflammatory changes. Spleen: Normal in size without focal abnormality. Adrenals/Urinary Tract: Kidneys are unremarkable with no urinary tract calculi or obstructive uropathy. The adrenals are stable. Bladder is unremarkable. Stomach/Bowel: No bowel obstruction or ileus. Scattered colonic diverticulosis without diverticulitis. No bowel wall thickening or inflammatory change. Lymphatic: No pathologic adenopathy. Reproductive: Prostate is unremarkable. Other: No free fluid or free intraperitoneal gas. No abdominal wall hernia. Musculoskeletal: No acute or destructive bony abnormalities. Reconstructed images demonstrate no additional findings. Review of the MIP images confirms the above findings. IMPRESSION: Vascular: 1. 4 cm ascending thoracic aortic aneurysm. Recommend annual imaging followup by CTA or MRA. This recommendation follows  2010 ACCF/AHA/AATS/ACR/ASA/SCA/SCAI/SIR/STS/SVM Guidelines for the Diagnosis and Management of Patients with Thoracic Aortic Disease. Circulation. 2010; 121: Z733-z630. Aortic aneurysm NOS (ICD10-I71.9) 2. No evidence of thoracoabdominal aortic dissection. 3. No evidence of pulmonary embolus. 4. Coronary artery atherosclerosis. Nonvascular: 1. Patchy bibasilar ground-glass airspace disease and lower lobe bronchial wall thickening, compatible with inflammation, infection, or aspiration. 2. Hepatic steatosis. 3. Scattered colonic diverticulosis without diverticulitis. Electronically Signed   By: Ozell Daring M.D.   On: 05/04/2024 15:10   DG Chest Portable 1 View Result Date: 05/04/2024 CLINICAL DATA:  Weakness EXAM: PORTABLE CHEST 1 VIEW COMPARISON:  October 07, 2015 FINDINGS: The cardiomediastinal silhouette is unchanged and enlarged in contour.Tortuous thoracic aorta. No pleural effusion. No pneumothorax. No acute pleuroparenchymal abnormality. IMPRESSION: No acute cardiopulmonary abnormality. Electronically Signed   By:  Corean Salter M.D.   On: 05/04/2024 13:45    Microbiology: Recent Results (from the past 240 hours)  Blood culture (routine x 2)     Status: None   Collection Time: 05/04/24  3:41 PM   Specimen: BLOOD  Result Value Ref Range Status   Specimen Description BLOOD LEFT ANTECUBITAL  Final   Special Requests   Final    BOTTLES DRAWN AEROBIC AND ANAEROBIC Blood Culture results may not be optimal due to an inadequate volume of blood received in culture bottles   Culture   Final    NO GROWTH 5 DAYS Performed at [facility], [address] [place], [place] [address]    Report Status 05/09/2024 FINAL  Final  Blood culture (routine x 2)     Status: None   Collection Time: 05/04/24  3:46 PM   Specimen: BLOOD  Result Value Ref Range Status   Specimen Description BLOOD RIGHT ANTECUBITAL  Final   Special Requests   Final    BOTTLES DRAWN AEROBIC AND ANAEROBIC Blood Culture adequate volume   Culture   Final    NO GROWTH 5 DAYS Performed at [facility], [address], [place], [place] [address]    Report Status 05/09/2024 FINAL  Final  Respiratory (~20 pathogens) panel by PCR     Status: None   Collection Time: 05/04/24  7:22 PM   Specimen: Anterior Nasal Swab; Respiratory  Result Value Ref Range Status   Adenovirus NOT DETECTED NOT DETECTED Final   Coronavirus 229E NOT DETECTED NOT DETECTED Final    Comment: (NOTE) The Coronavirus on the Respiratory Panel, DOES NOT test for the novel  Coronavirus (2019 nCoV)    Coronavirus HKU1 NOT DETECTED NOT DETECTED Final   Coronavirus NL63 NOT DETECTED NOT DETECTED Final   Coronavirus OC43 NOT DETECTED NOT DETECTED Final   Metapneumovirus NOT DETECTED NOT DETECTED Final   Rhinovirus / Enterovirus NOT DETECTED NOT DETECTED Final   Influenza A NOT DETECTED NOT DETECTED Final   Influenza B NOT DETECTED NOT DETECTED Final   Parainfluenza Virus 1 NOT DETECTED NOT DETECTED Final   Parainfluenza Virus 2 NOT DETECTED NOT DETECTED Final    Parainfluenza Virus 3 NOT DETECTED NOT DETECTED Final   Parainfluenza Virus 4 NOT DETECTED NOT DETECTED Final   Respiratory Syncytial Virus NOT DETECTED NOT DETECTED Final   Bordetella pertussis NOT DETECTED NOT DETECTED Final   Bordetella Parapertussis NOT DETECTED NOT DETECTED Final   Chlamydophila pneumoniae NOT DETECTED NOT DETECTED Final   Mycoplasma pneumoniae NOT DETECTED NOT DETECTED Final    Comment: Performed at [facility], [address], [place], [place] [address]  SARS Coronavirus  2 by RT PCR (hospital order, performed in [facility] hospital lab) *cepheid single result test*     Status: None   Collection Time: 05/04/24  7:22 PM  Result Value Ref Range Status   SARS Coronavirus 2 by RT PCR NEGATIVE NEGATIVE Final    Comment: Performed at [facility], [address], [place], [place] [address]     Labs: CBC: Recent Labs  Lab 05/07/24 0537 05/07/24 1822 05/08/24 0518 05/08/24 1133 05/08/24 1803 05/09/24 0543  WBC 16.7* 16.6* 15.3*  --  14.7* 13.0*  NEUTROABS 13.9* 8.5* 7.7  --  7.7 6.6  HGB 6.6* 7.4* 7.1* 7.2* 7.4* 7.5*  HCT 20.1* 22.0* 21.2* 21.3* 22.5* 22.5*  MCV 91.4 89.8 90.6  --  90.7 90.4  PLT 172 188 194  --  204 221   Basic Metabolic Panel: Recent Labs  Lab 05/04/24 1345 05/04/24 1358 05/05/24 0111 05/06/24 0207 05/07/24 0537  NA 138 139 142 141 142  K 4.3 4.2 4.2 4.1 3.7  CL 108 113* 111 113* 114*  CO2 25  --  28 21* 22  GLUCOSE 234* 264* 130* 159* 136*  BUN 27* 31* 29* 30* 18  CREATININE 0.95 0.80 0.88 0.63 0.79  CALCIUM  8.6*  --  8.7* 8.0* 7.9*  MG 1.9  --   --   --   --    Liver Function Tests: Recent Labs  Lab 05/04/24 1345  AST 24  ALT 17  ALKPHOS 65  BILITOT 0.9  PROT 7.3  ALBUMIN 2.7*   Recent Labs  Lab 05/04/24 1345  LIPASE 31   No results for input(s): AMMONIA in the last 168 hours. Cardiac Enzymes: No results for input(s): CKTOTAL, CKMB, CKMBINDEX, TROPONINI in the last 168 hours. BNP (last  3 results) Recent Labs    05/04/24 1332  BNP 2.3   CBG: Recent Labs  Lab 05/08/24 2011 05/08/24 2353 05/09/24 0406 05/09/24 0750 05/09/24 1130  GLUCAP 136* 128* 149* 158* 122*    Time spent: 35 minutes  Signed:  Elvan Sor  [facility] 05/09/2024 12:32 PM

## 2024-05-10 LAB — SURGICAL PATHOLOGY
